# Patient Record
Sex: Female | Born: 1965 | Race: White | Hispanic: No | Marital: Married | Smoking: Never smoker
Health system: Southern US, Community
[De-identification: ages and names within clinical notes are randomized; demographics above are authoritative.]

## PROBLEM LIST (undated history)

## (undated) DIAGNOSIS — T7840XA Allergy, unspecified, initial encounter: Secondary | ICD-10-CM

## (undated) DIAGNOSIS — O24419 Gestational diabetes mellitus in pregnancy, unspecified control: Secondary | ICD-10-CM

## (undated) DIAGNOSIS — J302 Other seasonal allergic rhinitis: Secondary | ICD-10-CM

## (undated) HISTORY — DX: Allergy, unspecified, initial encounter: T78.40XA

## (undated) HISTORY — PX: WISDOM TOOTH EXTRACTION: SHX21

## (undated) HISTORY — DX: Gestational diabetes mellitus in pregnancy, unspecified control: O24.419

## (undated) HISTORY — PX: POLYPECTOMY: SHX149

## (undated) HISTORY — DX: Other seasonal allergic rhinitis: J30.2

---

## 1999-08-22 ENCOUNTER — Other Ambulatory Visit: Admission: RE | Admit: 1999-08-22 | Discharge: 1999-08-22 | Payer: Self-pay | Admitting: Obstetrics and Gynecology

## 2001-07-31 ENCOUNTER — Other Ambulatory Visit: Admission: RE | Admit: 2001-07-31 | Discharge: 2001-07-31 | Payer: Self-pay | Admitting: Obstetrics and Gynecology

## 2002-09-03 ENCOUNTER — Other Ambulatory Visit: Admission: RE | Admit: 2002-09-03 | Discharge: 2002-09-03 | Payer: Self-pay | Admitting: Obstetrics and Gynecology

## 2003-09-06 ENCOUNTER — Other Ambulatory Visit: Admission: RE | Admit: 2003-09-06 | Discharge: 2003-09-06 | Payer: Self-pay | Admitting: Obstetrics and Gynecology

## 2004-12-05 ENCOUNTER — Other Ambulatory Visit: Admission: RE | Admit: 2004-12-05 | Discharge: 2004-12-05 | Payer: Self-pay | Admitting: Obstetrics and Gynecology

## 2013-05-04 ENCOUNTER — Other Ambulatory Visit: Payer: Self-pay | Admitting: Obstetrics and Gynecology

## 2013-05-04 DIAGNOSIS — R928 Other abnormal and inconclusive findings on diagnostic imaging of breast: Secondary | ICD-10-CM

## 2013-05-12 ENCOUNTER — Ambulatory Visit
Admission: RE | Admit: 2013-05-12 | Discharge: 2013-05-12 | Disposition: A | Discharge status: Home / Self Care | Payer: BC Managed Care – PPO | Source: Ambulatory Visit | Attending: Obstetrics and Gynecology | Admitting: Obstetrics and Gynecology

## 2013-05-12 DIAGNOSIS — R928 Other abnormal and inconclusive findings on diagnostic imaging of breast: Secondary | ICD-10-CM

## 2014-07-05 ENCOUNTER — Encounter: Payer: Self-pay | Admitting: Internal Medicine

## 2014-07-05 ENCOUNTER — Ambulatory Visit (INDEPENDENT_AMBULATORY_CARE_PROVIDER_SITE_OTHER): Payer: BC Managed Care – PPO | Admitting: Internal Medicine

## 2014-07-05 VITALS — BP 112/80 | HR 84 | Temp 97.9°F | Resp 16 | Ht 60.25 in | Wt 131.2 lb

## 2014-07-05 DIAGNOSIS — O24419 Gestational diabetes mellitus in pregnancy, unspecified control: Secondary | ICD-10-CM

## 2014-07-05 DIAGNOSIS — O99810 Abnormal glucose complicating pregnancy: Secondary | ICD-10-CM

## 2014-07-05 DIAGNOSIS — E8881 Metabolic syndrome: Secondary | ICD-10-CM

## 2014-07-05 DIAGNOSIS — K2100 Gastro-esophageal reflux disease with esophagitis, without bleeding: Secondary | ICD-10-CM

## 2014-07-05 DIAGNOSIS — E88819 Insulin resistance, unspecified: Secondary | ICD-10-CM

## 2014-07-05 DIAGNOSIS — R7309 Other abnormal glucose: Secondary | ICD-10-CM

## 2014-07-05 DIAGNOSIS — E559 Vitamin D deficiency, unspecified: Secondary | ICD-10-CM

## 2014-07-05 DIAGNOSIS — Z1212 Encounter for screening for malignant neoplasm of rectum: Secondary | ICD-10-CM

## 2014-07-05 DIAGNOSIS — Z111 Encounter for screening for respiratory tuberculosis: Secondary | ICD-10-CM

## 2014-07-05 DIAGNOSIS — Z0000 Encounter for general adult medical examination without abnormal findings: Secondary | ICD-10-CM

## 2014-07-05 HISTORY — DX: Gestational diabetes mellitus in pregnancy, unspecified control: O24.419

## 2014-07-05 NOTE — Progress Notes (Signed)
Patient ID: Garnet Koyanagi, female   DOB: [birth year], 48 y.o.   MRN: [id number]  Annual Screening Comprehensive Examination  This very nice 48 y.o. MWFpresents for complete physical.  Patient has been followed for Prediabetes and Vitamin D Deficiency.   Patient's BP has been controlled and today's BP is 112/80 mmHg.  Patient denies any cardiac symptoms as chest pain, palpitations, shortness of breath, dizziness or ankle swelling.     Patient's Lipids controlled with diet.  Last lipids were TC 177, TG 82, HDL 68 and LDL 93 - all at goal in Sept 2014.  Patient has Gestational Diabetes in 1996  and patient denies reactive hypoglycemic symptoms, visual blurring, diabetic polys, or paresthesias. In 2013 she hasd a normal A1c 5.4% with elevated insulin of 39 suspect for insulin resistance. Last A1c was 5.3% with normal insulin in Sept 2014.   Finally, patient has history of Vitamin D Deficiency (35 in 2009) and last Vitamin D was 90 in Sept 2014..  Medication Sig   G Zyrtec 10 mg  prn  . Cholecalciferol (VITAMIN D PO) Take 2,000  Units by mouth daily.    No Known Allergies  FHx - unremarkable SHX - M x 24 yrs - Husb 48 yo =- Worked 16 yr for [employer].   History  Substance Use Topics  . Smoking status: Never Smoker   . Smokeless tobacco: Never Used  . Alcohol Use: No     Comment: Social    ROS Constitutional: Denies fever, chills, weight loss/gain, headaches, insomnia, fatigue, night sweats, and change in appetite. Eyes: Denies redness, blurred vision, diplopia, discharge, itchy, watery eyes.  ENT: Denies discharge, congestion, post nasal drip, epistaxis, sore throat, earache, hearing loss, dental pain, Tinnitus, Vertigo, Sinus pain, snoring.  Cardio: Denies chest pain, palpitations, irregular heartbeat, syncope, dyspnea, diaphoresis, orthopnea, PND, claudication, edema Respiratory: denies cough, dyspnea, DOE, pleurisy, hoarseness, laryngitis, wheezing.  Gastrointestinal: Denies  dysphagia, heartburn, reflux, water brash, pain, cramps, nausea, vomiting, bloating, diarrhea, constipation, hematemesis, melena, hematochezia, jaundice, hemorrhoids Genitourinary: Denies dysuria, frequency, urgency, nocturia, hesitancy, discharge, hematuria, flank pain Breast: Breast lumps, nipple discharge, bleeding.  Musculoskeletal: Denies arthralgia, myalgia, stiffness, Jt. Swelling, pain, limp, and strain/sprain. Denies falls. Skin: Denies puritis, rash, hives, warts, acne, eczema, changing in skin lesion Neuro: No weakness, tremor, incoordination, spasms, paresthesia, pain Psychiatric: Denies confusion, memory loss, sensory loss. Denies Depression. Endocrine: Denies change in weight, skin, hair change, nocturia, and paresthesia, diabetic polys, visual blurring, hyper / hypo glycemic episodes.  Heme/Lymph: No excessive bleeding, bruising, enlarged lymph nodes.  Physical Exam  BP 112/80  P 84  T 97.9 F   R 16  Ht 5' 0.25"   Wt 131 lb   BMI 25.42   General Appearance: Well nourished and in no apparent distress. Eyes: PERRLA, EOMs, conjunctiva no swelling or erythema, normal fundi and vessels. Sinuses: No frontal/maxillary tenderness ENT/Mouth: EACs patent / TMs  nl. Nares clear without erythema, swelling, mucoid exudates. Oral hygiene is good. No erythema, swelling, or exudate. Tongue normal, non-obstructing. Tonsils not swollen or erythematous. Hearing normal.  Neck: Supple, thyroid normal. No bruits, nodes or JVD. Respiratory: Respiratory effort normal.  BS equal and clear bilateral without rales, rhonci, wheezing or stridor. Cardio: Heart sounds are normal with regular rate and rhythm and no murmurs, rubs or gallops. Peripheral pulses are normal and equal bilaterally without edema. No aortic or femoral bruits. Chest: symmetric with normal excursions and percussion. Breasts: Deferred to GYN. Abdomen: Flat, soft, with bowl sounds. Nontender,  no guarding, rebound, hernias, masses,  or organomegaly.  Lymphatics: Non tender without lymphadenopathy.  Genitourinary: Deferred to GYN. Musculoskeletal: Full ROM all peripheral extremities, joint stability, 5/5 strength, and normal gait. Skin: Warm and dry without rashes, lesions, cyanosis, clubbing or  ecchymosis.  Neuro: Cranial nerves intact, reflexes equal bilaterally. Normal muscle tone, no cerebellar symptoms. Sensation intact.  Pysch: Awake and oriented X 3, normal affect, Insight and Judgment appropriate.   Assessment and Plan  1. Annual Screening Examination 2. Pre Diabetes/Hx of Insulin Resistance 3. Vitamin D Deficiency  Continue prudent diet as discussed, weight control, BP monitoring, regular exercise, and medications. Discussed med's effects and SE's. Screening labs and tests as requested with regular follow-up as recommended. Recommended Dr Fara Olden Fuhrman's books "The End of Dieting ...the End of Diabetes"

## 2014-07-05 NOTE — Patient Instructions (Signed)
 Recommend the book "The END of DIETING" by Dr Joel Furman   and the book "The END of DIABETES " by Dr Joel Fuhrman  At [employer] - get book & Audio CD's      Being diabetic has a  300% increased risk for heart attack, stroke, cancer, and alzheimer- type vascular dementia. It is very important that you work harder with diet by avoiding all foods that are white except chicken & fish. Avoid white rice (brown & wild rice is OK), white potatoes (sweetpotatoes in moderation is OK), White bread or wheat bread or anything made out of white flour like bagels, donuts, rolls, buns, biscuits, cakes, pastries, cookies, pizza crust, and pasta (made from white flour & egg whites) - vegetarian pasta or spinach or wheat pasta is OK. Multigrain breads like Arnold's or [employer], or multigrain sandwich thins or flatbreads.  Diet, exercise and weight loss can reverse and cure diabetes in the early stages.  Diet, exercise and weight loss is very important in the control and prevention of complications of diabetes which affects every system in your body, ie. Brain - dementia/stroke, eyes - glaucoma/blindness, heart - heart attack/heart failure, kidneys - dialysis, stomach - gastric paralysis, intestines - malabsorption, nerves - severe painful neuritis, circulation - gangrene & loss of a leg(s), and finally cancer and Alzheimers.    I recommend avoid fried & greasy foods,  sweets/candy, white rice (brown or wild rice or Quinoa is OK), white potatoes (sweet potatoes are OK) - anything made from white flour - bagels, doughnuts, rolls, buns, biscuits,white and wheat breads, pizza crust and traditional pasta made of white flour & egg white(vegetarian pasta or spinach or wheat pasta is OK).  Multi-grain bread is OK - like multi-grain flat bread or sandwich thins. Avoid alcohol in excess. Exercise is also important.    Eat all the vegetables you want - avoid meat, especially red meat and dairy - especially cheese.  Cheese  is the most concentrated form of trans-fats which is the worst thing to clog up our arteries. Veggie cheese is OK which can be found in the fresh produce section at [employer] or [employer] or [employer]  Preventive Care for Adults A healthy lifestyle and preventive care can promote health and wellness. Preventive health guidelines for women include the following key practices.  A routine yearly physical is a good way to check with your health care provider about your health and preventive screening. It is a chance to share any concerns and updates on your health and to receive a thorough exam.  Visit your dentist for a routine exam and preventive care every 6 months. Brush your teeth twice a day and floss once a day. Good oral hygiene prevents tooth decay and gum disease.  The frequency of eye exams is based on your age, health, family medical history, use of contact lenses, and other factors. Follow your health care provider's recommendations for frequency of eye exams.  Eat a healthy diet. Foods like vegetables, fruits, whole grains, low-fat dairy products, and lean protein foods contain the nutrients you need without too many calories. Decrease your intake of foods high in solid fats, added sugars, and salt. Eat the right amount of calories for you.Get information about a proper diet from your health care provider, if necessary.  Regular physical exercise is one of the most important things you can do for your health. Most adults should get at least 150 minutes of moderate-intensity exercise (any activity that increases   your heart rate and causes you to sweat) each week. In addition, most adults need muscle-strengthening exercises on 2 or more days a week.  Maintain a healthy weight. The body mass index (BMI) is a screening tool to identify possible weight problems. It provides an estimate of body fat based on height and weight. Your health care provider can find your BMI and can help you  achieve or maintain a healthy weight.For adults 20 years and older:  A BMI below 18.5 is considered underweight.  A BMI of 18.5 to 24.9 is normal.  A BMI of 25 to 29.9 is considered overweight.  A BMI of 30 and above is considered obese.  Maintain normal blood lipids and cholesterol levels by exercising and minimizing your intake of saturated fat. Eat a balanced diet with plenty of fruit and vegetables. Blood tests for lipids and cholesterol should begin at age 20 and be repeated every 5 years. If your lipid or cholesterol levels are high, you are over 50, or you are at high risk for heart disease, you may need your cholesterol levels checked more frequently.Ongoing high lipid and cholesterol levels should be treated with medicines if diet and exercise are not working.  If you smoke, find out from your health care provider how to quit. If you do not use tobacco, do not start.  Lung cancer screening is recommended for adults aged 55-80 years who are at high risk for developing lung cancer because of a history of smoking. A yearly low-dose CT scan of the lungs is recommended for people who have at least a 30-pack-year history of smoking and are a current smoker or have quit within the past 15 years. A pack year of smoking is smoking an average of 1 pack of cigarettes a day for 1 year (for example: 1 pack a day for 30 years or 2 packs a day for 15 years). Yearly screening should continue until the smoker has stopped smoking for at least 15 years. Yearly screening should be stopped for people who develop a health problem that would prevent them from having lung cancer treatment.  If you are pregnant, do not drink alcohol. If you are breastfeeding, be very cautious about drinking alcohol. If you are not pregnant and choose to drink alcohol, do not have more than 1 drink per day. One drink is considered to be 12 ounces (355 mL) of beer, 5 ounces (148 mL) of wine, or 1.5 ounces (44 mL) of liquor.  Avoid  use of street drugs. Do not share needles with anyone. Ask for help if you need support or instructions about stopping the use of drugs.  High blood pressure causes heart disease and increases the risk of stroke. Your blood pressure should be checked at least every 1 to 2 years. Ongoing high blood pressure should be treated with medicines if weight loss and exercise do not work.  If you are 55-79 years old, ask your health care provider if you should take aspirin to prevent strokes.  Diabetes screening involves taking a blood sample to check your fasting blood sugar level. This should be done once every 3 years, after age 45, if you are within normal weight and without risk factors for diabetes. Testing should be considered at a younger age or be carried out more frequently if you are overweight and have at least 1 risk factor for diabetes.  Breast cancer screening is essential preventive care for women. You should practice "breast self-awareness." This means understanding the   understanding the normal appearance and feel of your breasts and may include breast self-examination. Any changes detected, no matter how small, should be reported to a health care provider. Women in their 76s and 30s should have a clinical breast exam (CBE) by a health care provider as part of a regular health exam every 1 to 3 years. After age 8, women should have a CBE every year. Starting at age 33, women should consider having a mammogram (breast X-ray test) every year. Women who have a family history of breast cancer should talk to their health care provider about genetic screening. Women at a high risk of breast cancer should talk to their health care providers about having an MRI and a mammogram every year.  Breast cancer gene (BRCA)-related cancer risk assessment is recommended for women who have family members with BRCA-related cancers. BRCA-related cancers  include breast, ovarian, tubal, and peritoneal cancers. Having family members with these cancers may be associated with an increased risk for harmful changes (mutations) in the breast cancer genes BRCA1 and BRCA2. Results of the assessment will determine the need for genetic counseling and BRCA1 and BRCA2 testing.  Routine pelvic exams to screen for cancer are no longer recommended for nonpregnant women who are considered low risk for cancer of the pelvic organs (ovaries, uterus, and vagina) and who do not have symptoms. Ask your health care provider if a screening pelvic exam is right for you.  If you have had past treatment for cervical cancer or a condition that could lead to cancer, you need Pap tests and screening for cancer for at least 20 years after your treatment. If Pap tests have been discontinued, your risk factors (such as having a new sexual partner) need to be reassessed to determine if screening should be resumed. Some women have medical problems that increase the chance of getting cervical cancer. In these cases, your health care provider may recommend more frequent screening and Pap tests.  The HPV test is an additional test that may be used for cervical cancer screening. The HPV test looks for the virus that can cause the cell changes on the cervix. The cells collected during the Pap test can be tested for HPV. The HPV test could be used to screen women aged 48 years and older, and should be used in women of any age who have unclear Pap test results. After the age of 48, women should have HPV testing at the same frequency as a Pap test.  Colorectal cancer can be detected and often prevented. Most routine colorectal cancer screening begins at the age of 12 years and continues through age 96 years. However, your health care provider may recommend screening at an earlier age if you have risk factors for colon cancer. On a yearly basis, your health care provider may provide home test kits to  check for hidden blood in the stool. Use of a small camera at the end of a tube, to directly examine the colon (sigmoidoscopy or colonoscopy), can detect the earliest forms of colorectal cancer. Talk to your health care provider about this at age 33, when routine screening begins. Direct exam of the colon should be repeated every 5-10 years through age 53 years, unless early forms of pre-cancerous polyps or small growths are found.  People who are at an increased risk for hepatitis B should be screened for this virus. You are considered at high risk for hepatitis B if:  You were born in a country where hepatitis  often. Talk with your health care provider about which countries are considered high risk.  Your parents were born in a high-risk country and you have not received a shot to protect against hepatitis B (hepatitis B vaccine).  You have HIV or AIDS.  You use needles to inject street drugs.  You live with, or have sex with, someone who has hepatitis B.  You get hemodialysis treatment.  You take certain medicines for conditions like cancer, organ transplantation, and autoimmune conditions.  Hepatitis C blood testing is recommended for all people born from 1945 through 1965 and any individual with known risks for hepatitis C.  Practice safe sex. Use condoms and avoid high-risk sexual practices to reduce the spread of sexually transmitted infections (STIs). STIs include gonorrhea, chlamydia, syphilis, trichomonas, herpes, HPV, and human immunodeficiency virus (HIV). Herpes, HIV, and HPV are viral illnesses that have no cure. They can result in disability, cancer, and death.  You should be screened for sexually transmitted illnesses (STIs) including gonorrhea and chlamydia if:  You are sexually active and are younger than 24 years.  You are older than 24 years and your health care provider tells you that you are at risk for this type of infection.  Your sexual activity has changed since you were last screened and you are at an increased risk for chlamydia or  gonorrhea. Ask your health care provider if you are at risk.  If you are at risk of being infected with HIV, it is recommended that you take a prescription medicine daily to prevent HIV infection. This is called preexposure prophylaxis (PrEP). You are considered at risk if:  You are a heterosexual woman, are sexually active, and are at increased risk for HIV infection.  You take drugs by injection.  You are sexually active with a partner who has HIV.  Talk with your health care provider about whether you are at high risk of being infected with HIV. If you choose to begin PrEP, you should first be tested for HIV. You should then be tested every 3 months for as long as you are taking PrEP.  Osteoporosis is a disease in which the bones lose minerals and strength with aging. This can result in serious bone fractures or breaks. The risk of osteoporosis can be identified using a bone density scan. Women ages 65 years and over and women at risk for fractures or osteoporosis should discuss screening with their health care providers. Ask your health care provider whether you should take a calcium supplement or vitamin D to reduce the rate of osteoporosis.  Menopause can be associated with physical symptoms and risks. Hormone replacement therapy is available to decrease symptoms and risks. You should talk to your health care provider about whether hormone replacement therapy is right for you.  Use sunscreen. Apply sunscreen liberally and repeatedly throughout the day. You should seek shade when your shadow is shorter than you. Protect yourself by wearing long sleeves, pants, a wide-brimmed hat, and sunglasses year round, whenever you are outdoors.  Once a month, do a whole body skin exam, using a mirror to look at the skin on your back. Tell your health care provider of new moles, moles that have irregular borders, moles that are larger than a pencil eraser, or moles that have changed in shape or  color.  Stay current with required vaccines (immunizations).  Influenza vaccine. All adults should be immunized every year.  Tetanus, diphtheria, and acellular pertussis (Td, Tdap) vaccine. Pregnant women should receive   1 dose of Tdap vaccine during each pregnancy. The dose should be obtained regardless of the length of time since the last dose. Immunization is preferred during the 27th-36th week of gestation. An adult who has not previously received Tdap or who does not know her vaccine status should receive 1 dose of Tdap. This initial dose should be followed by tetanus and diphtheria toxoids (Td) booster doses every 10 years. Adults with an unknown or incomplete history of completing a 3-dose immunization series with Td-containing vaccines should begin or complete a primary immunization series including a Tdap dose. Adults should receive a Td booster every 10 years.  Varicella vaccine. An adult without evidence of immunity to varicella should receive 2 doses or a second dose if she has previously received 1 dose. Pregnant females who do not have evidence of immunity should receive the first dose after pregnancy. This first dose should be obtained before leaving the health care facility. The second dose should be obtained 4-8 weeks after the first dose.  Human papillomavirus (HPV) vaccine. Females aged 13-26 years who have not received the vaccine previously should obtain the 3-dose series. The vaccine is not recommended for use in pregnant females. However, pregnancy testing is not needed before receiving a dose. If a female is found to be pregnant after receiving a dose, no treatment is needed. In that case, the remaining doses should be delayed until after the pregnancy. Immunization is recommended for any person with an immunocompromised condition through the age of 26 years if she did not get any or all doses earlier. During the 3-dose series, the second dose should be obtained 4-8 weeks after the  first dose. The third dose should be obtained 24 weeks after the first dose and 16 weeks after the second dose.  Zoster vaccine. One dose is recommended for adults aged 60 years or older unless certain conditions are present.  Measles, mumps, and rubella (MMR) vaccine. Adults born before 1957 generally are considered immune to measles and mumps. Adults born in 1957 or later should have 1 or more doses of MMR vaccine unless there is a contraindication to the vaccine or there is laboratory evidence of immunity to each of the three diseases. A routine second dose of MMR vaccine should be obtained at least 28 days after the first dose for students attending postsecondary schools, health care workers, or international travelers. People who received inactivated measles vaccine or an unknown type of measles vaccine during 1963-1967 should receive 2 doses of MMR vaccine. People who received inactivated mumps vaccine or an unknown type of mumps vaccine before 1979 and are at high risk for mumps infection should consider immunization with 2 doses of MMR vaccine. For females of childbearing age, rubella immunity should be determined. If there is no evidence of immunity, females who are not pregnant should be vaccinated. If there is no evidence of immunity, females who are pregnant should delay immunization until after pregnancy. Unvaccinated health care workers born before 1957 who lack laboratory evidence of measles, mumps, or rubella immunity or laboratory confirmation of disease should consider measles and mumps immunization with 2 doses of MMR vaccine or rubella immunization with 1 dose of MMR vaccine.  Pneumococcal 13-valent conjugate (PCV13) vaccine. When indicated, a person who is uncertain of her immunization history and has no record of immunization should receive the PCV13 vaccine. An adult aged 19 years or older who has certain medical conditions and has not been previously immunized should receive 1 dose of    dose of PCV13 vaccine. This PCV13 should be followed with a dose of pneumococcal polysaccharide (PPSV23) vaccine. The PPSV23 vaccine dose should be obtained at least 8 weeks after the dose of PCV13 vaccine. An adult aged 11 years or older who has certain medical conditions and previously received 1 or more doses of PPSV23 vaccine should receive 1 dose of PCV13. The PCV13 vaccine dose should be obtained 1 or more years after the last PPSV23 vaccine dose.  Pneumococcal polysaccharide (PPSV23) vaccine. When PCV13 is also indicated, PCV13 should be obtained first. All adults aged 12 years and older should be immunized. An adult younger than age 42 years who has certain medical conditions should be immunized. Any person who resides in a nursing home or long-term care facility should be immunized. An adult smoker should be immunized. People with an immunocompromised condition and certain other conditions should receive both PCV13 and PPSV23 vaccines. People with human immunodeficiency virus (HIV) infection should be immunized as soon as possible after diagnosis. Immunization during chemotherapy or radiation therapy should be avoided. Routine use of PPSV23 vaccine is not recommended for American Indians, [place] Natives, or people younger than 65 years unless there are medical conditions that require PPSV23 vaccine. When indicated, people who have unknown immunization and have no record of immunization should receive PPSV23 vaccine. One-time revaccination 5 years after the first dose of PPSV23 is recommended for people aged 19-64 years who have chronic kidney failure, nephrotic syndrome, asplenia, or immunocompromised conditions. People who received 1-2 doses of PPSV23 before age 96 years should receive another dose of PPSV23 vaccine at age 73 years or later if at least 5 years have passed since the previous dose. Doses of PPSV23 are not needed for  people immunized with PPSV23 at or after age 85 years.  Meningococcal vaccine. Adults with asplenia or persistent complement component deficiencies should receive 2 doses of quadrivalent meningococcal conjugate (MenACWY-D) vaccine. The doses should be obtained at least 2 months apart. Microbiologists working with certain meningococcal bacteria, [employer] recruits, people at risk during an outbreak, and people who travel to or live in countries with a high rate of meningitis should be immunized. A first-year college student up through age 28 years who is living in a residence hall should receive a dose if she did not receive a dose on or after her 16th birthday. Adults who have certain high-risk conditions should receive one or more doses of vaccine.  Hepatitis A vaccine. Adults who wish to be protected from this disease, have certain high-risk conditions, work with hepatitis A-infected animals, work in hepatitis A research labs, or travel to or work in countries with a high rate of hepatitis A should be immunized. Adults who were previously unvaccinated and who anticipate close contact with an international adoptee during the first 60 days after arrival in the [place] from a country with a high rate of hepatitis A should be immunized.  Hepatitis B vaccine. Adults who wish to be protected from this disease, have certain high-risk conditions, may be exposed to blood or other infectious body fluids, are household contacts or sex partners of hepatitis B positive people, are clients or workers in certain care facilities, or travel to or work in countries with a high rate of hepatitis B should be immunized.  Haemophilus influenzae type b (Hib) vaccine. A previously unvaccinated person with asplenia or sickle cell disease or having a scheduled splenectomy should receive 1 dose of Hib vaccine. Regardless of previous immunization, a recipient of a  cell transplant should receive a 3-dose series 6-12 months after her successful transplant. Hib vaccine is not recommended for  adults with HIV infection. Preventive Services / Frequency  Ages 40 to 64 years  Blood pressure check.** / Every 1 to 2 years.  Lipid and cholesterol check.** / Every 5 years beginning at age 20 years.  Lung cancer screening. / Every year if you are aged 55-80 years and have a 30-pack-year history of smoking and currently smoke or have quit within the past 15 years. Yearly screening is stopped once you have quit smoking for at least 15 years or develop a health problem that would prevent you from having lung cancer treatment.  Clinical breast exam.** / Every year after age 40 years.  BRCA-related cancer risk assessment.** / For women who have family members with a BRCA-related cancer (breast, ovarian, tubal, or peritoneal cancers).  Mammogram.** / Every year beginning at age 40 years and continuing for as long as you are in good health. Consult with your health care provider.  Pap test.** / Every 3 years starting at age 30 years through age 65 or 70 years with a history of 3 consecutive normal Pap tests.  HPV screening.** / Every 3 years from ages 30 years through ages 65 to 70 years with a history of 3 consecutive normal Pap tests.  Fecal occult blood test (FOBT) of stool. / Every year beginning at age 50 years and continuing until age 75 years. You may not need to do this test if you get a colonoscopy every 10 years.  Flexible sigmoidoscopy or colonoscopy.** / Every 5 years for a flexible sigmoidoscopy or every 10 years for a colonoscopy beginning at age 50 years and continuing until age 75 years.  Hepatitis C blood test.** / For all people born from 1945 through 1965 and any individual with known risks for hepatitis C.  Skin self-exam. / Monthly.  Influenza vaccine. / Every year.  Tetanus, diphtheria, and acellular pertussis (Tdap/Td) vaccine.** / Consult your health care provider. Pregnant women should receive 1 dose of Tdap vaccine during each pregnancy. 1 dose of Td every 10  years.  Varicella vaccine.** / Consult your health care provider. Pregnant females who do not have evidence of immunity should receive the first dose after pregnancy.  Zoster vaccine.** / 1 dose for adults aged 60 years or older.  Measles, mumps, rubella (MMR) vaccine.** / You need at least 1 dose of MMR if you were born in 1957 or later. You may also need a 2nd dose. For females of childbearing age, rubella immunity should be determined. If there is no evidence of immunity, females who are not pregnant should be vaccinated. If there is no evidence of immunity, females who are pregnant should delay immunization until after pregnancy.  Pneumococcal 13-valent conjugate (PCV13) vaccine.** / Consult your health care provider.  Pneumococcal polysaccharide (PPSV23) vaccine.** / 1 to 2 doses if you smoke cigarettes or if you have certain conditions.  Meningococcal vaccine.** / Consult your health care provider.  Hepatitis A vaccine.** / Consult your health care provider.  Hepatitis B vaccine.** / Consult your health care provider.  Haemophilus influenzae type b (Hib) vaccine.** / Consult your health care provider.  

## 2014-07-06 LAB — URINALYSIS, MICROSCOPIC ONLY
Bacteria, UA: NONE SEEN
Casts: NONE SEEN
Crystals: NONE SEEN
Squamous Epithelial / LPF: NONE SEEN

## 2014-07-06 LAB — CBC WITH DIFFERENTIAL/PLATELET
Basophils Absolute: 0.1 10*3/uL (ref 0.0–0.1)
Basophils Relative: 1 % (ref 0–1)
Eosinophils Absolute: 0.2 10*3/uL (ref 0.0–0.7)
Eosinophils Relative: 2 % (ref 0–5)
HCT: 38.3 % (ref 36.0–46.0)
Hemoglobin: 12.5 g/dL (ref 12.0–15.0)
Lymphocytes Relative: 29 % (ref 12–46)
Lymphs Abs: 2.9 10*3/uL (ref 0.7–4.0)
MCH: 29.8 pg (ref 26.0–34.0)
MCHC: 32.6 g/dL (ref 30.0–36.0)
MCV: 91.2 fL (ref 78.0–100.0)
Monocytes Absolute: 0.7 10*3/uL (ref 0.1–1.0)
Monocytes Relative: 7 % (ref 3–12)
Neutro Abs: 6.1 10*3/uL (ref 1.7–7.7)
Neutrophils Relative %: 61 % (ref 43–77)
Platelets: 325 10*3/uL (ref 150–400)
RBC: 4.2 MIL/uL (ref 3.87–5.11)
RDW: 13.9 % (ref 11.5–15.5)
WBC: 10 10*3/uL (ref 4.0–10.5)

## 2014-07-06 LAB — HEPATIC FUNCTION PANEL
ALT: 25 U/L (ref 0–35)
AST: 24 U/L (ref 0–37)
Albumin: 4.4 g/dL (ref 3.5–5.2)
Alkaline Phosphatase: 66 U/L (ref 39–117)
Bilirubin, Direct: <0.1 mg/dL (ref 0.0–0.3)
Total Bilirubin: 0.2 mg/dL (ref 0.2–1.2)
Total Protein: 7.1 g/dL (ref 6.0–8.3)

## 2014-07-06 LAB — BASIC METABOLIC PANEL WITH GFR
BUN: 16 mg/dL (ref 6–23)
CO2: 23 mEq/L (ref 19–32)
Calcium: 9.3 mg/dL (ref 8.4–10.5)
Chloride: 103 mEq/L (ref 96–112)
Creat: 0.66 mg/dL (ref 0.50–1.10)
GFR, Est African American: >89 mL/min
GFR, Est Non African American: >89 mL/min
Glucose, Bld: 79 mg/dL (ref 70–99)
Potassium: 4.2 mEq/L (ref 3.5–5.3)
Sodium: 138 mEq/L (ref 135–145)

## 2014-07-06 LAB — LIPID PANEL
Cholesterol: 177 mg/dL (ref 0–200)
HDL: 73 mg/dL (ref >39)
LDL Cholesterol: 76 mg/dL (ref 0–99)
Total CHOL/HDL Ratio: 2.4 Ratio
Triglycerides: 139 mg/dL (ref <150)
VLDL: 28 mg/dL (ref 0–40)

## 2014-07-06 LAB — TSH: TSH: 1.218 u[IU]/mL (ref 0.350–4.500)

## 2014-07-06 LAB — MICROALBUMIN / CREATININE URINE RATIO
Creatinine, Urine: 58.1 mg/dL
Microalb Creat Ratio: 8.6 mg/g (ref 0.0–30.0)
Microalb, Ur: 0.5 mg/dL (ref 0.00–1.89)

## 2014-07-06 LAB — MAGNESIUM: Magnesium: 1.9 mg/dL (ref 1.5–2.5)

## 2014-07-06 LAB — VITAMIN B12: Vitamin B-12: 333 pg/mL (ref 211–911)

## 2014-07-06 LAB — HEMOGLOBIN A1C
Hgb A1c MFr Bld: 5.2 % (ref <5.7)
Mean Plasma Glucose: 103 mg/dL (ref <117)

## 2014-07-06 LAB — VITAMIN D 25 HYDROXY (VIT D DEFICIENCY, FRACTURES): Vit D, 25-Hydroxy: 116 ng/mL — ABNORMAL HIGH (ref 30–89)

## 2014-07-06 LAB — INSULIN, FASTING: Insulin fasting, serum: 11.2 u[IU]/mL (ref 2.0–19.6)

## 2014-07-08 LAB — TB SKIN TEST
Induration: 0 mm
TB Skin Test: NEGATIVE

## 2015-04-18 ENCOUNTER — Other Ambulatory Visit: Payer: Self-pay

## 2015-07-07 ENCOUNTER — Ambulatory Visit (INDEPENDENT_AMBULATORY_CARE_PROVIDER_SITE_OTHER): Payer: BLUE CROSS/BLUE SHIELD | Admitting: Internal Medicine

## 2015-07-07 ENCOUNTER — Encounter: Payer: Self-pay | Admitting: Internal Medicine

## 2015-07-07 VITALS — BP 116/82 | HR 100 | Temp 98.1°F | Resp 18 | Ht 60.0 in | Wt 131.0 lb

## 2015-07-07 DIAGNOSIS — E559 Vitamin D deficiency, unspecified: Secondary | ICD-10-CM

## 2015-07-07 DIAGNOSIS — Z0000 Encounter for general adult medical examination without abnormal findings: Secondary | ICD-10-CM | POA: Diagnosis not present

## 2015-07-07 DIAGNOSIS — Z111 Encounter for screening for respiratory tuberculosis: Secondary | ICD-10-CM | POA: Diagnosis not present

## 2015-07-07 DIAGNOSIS — Z6825 Body mass index (BMI) 25.0-25.9, adult: Secondary | ICD-10-CM

## 2015-07-07 DIAGNOSIS — R030 Elevated blood-pressure reading, without diagnosis of hypertension: Secondary | ICD-10-CM

## 2015-07-07 DIAGNOSIS — Z1212 Encounter for screening for malignant neoplasm of rectum: Secondary | ICD-10-CM

## 2015-07-07 DIAGNOSIS — Z23 Encounter for immunization: Secondary | ICD-10-CM | POA: Diagnosis not present

## 2015-07-07 DIAGNOSIS — IMO0001 Reserved for inherently not codable concepts without codable children: Secondary | ICD-10-CM

## 2015-07-07 DIAGNOSIS — E7800 Pure hypercholesterolemia, unspecified: Secondary | ICD-10-CM

## 2015-07-07 DIAGNOSIS — Z79899 Other long term (current) drug therapy: Secondary | ICD-10-CM | POA: Diagnosis not present

## 2015-07-07 DIAGNOSIS — E88819 Insulin resistance, unspecified: Secondary | ICD-10-CM

## 2015-07-07 DIAGNOSIS — R5383 Other fatigue: Secondary | ICD-10-CM

## 2015-07-07 DIAGNOSIS — E8881 Metabolic syndrome: Secondary | ICD-10-CM

## 2015-07-07 LAB — HEPATIC FUNCTION PANEL
ALT: 22 U/L (ref 6–29)
AST: 23 U/L (ref 10–35)
Albumin: 4.7 g/dL (ref 3.6–5.1)
Alkaline Phosphatase: 64 U/L (ref 33–115)
Bilirubin, Direct: 0.1 mg/dL (ref <=0.2)
Indirect Bilirubin: 0.3 mg/dL (ref 0.2–1.2)
Total Bilirubin: 0.4 mg/dL (ref 0.2–1.2)
Total Protein: 7.8 g/dL (ref 6.1–8.1)

## 2015-07-07 LAB — CBC WITH DIFFERENTIAL/PLATELET
Basophils Absolute: 0 10*3/uL (ref 0.0–0.1)
Basophils Relative: 0 % (ref 0–1)
Eosinophils Absolute: 0.2 10*3/uL (ref 0.0–0.7)
Eosinophils Relative: 2 % (ref 0–5)
HCT: 41.8 % (ref 36.0–46.0)
Hemoglobin: 14.2 g/dL (ref 12.0–15.0)
Lymphocytes Relative: 30 % (ref 12–46)
Lymphs Abs: 3.3 10*3/uL (ref 0.7–4.0)
MCH: 30.6 pg (ref 26.0–34.0)
MCHC: 34 g/dL (ref 30.0–36.0)
MCV: 90.1 fL (ref 78.0–100.0)
MPV: 10.8 fL (ref 8.6–12.4)
Monocytes Absolute: 0.7 10*3/uL (ref 0.1–1.0)
Monocytes Relative: 6 % (ref 3–12)
Neutro Abs: 6.8 10*3/uL (ref 1.7–7.7)
Neutrophils Relative %: 62 % (ref 43–77)
Platelets: 332 10*3/uL (ref 150–400)
RBC: 4.64 MIL/uL (ref 3.87–5.11)
RDW: 13.4 % (ref 11.5–15.5)
WBC: 11 10*3/uL — ABNORMAL HIGH (ref 4.0–10.5)

## 2015-07-07 LAB — IRON AND TIBC
%SAT: 19 % (ref 11–50)
Iron: 69 ug/dL (ref 40–190)
TIBC: 362 ug/dL (ref 250–450)
UIBC: 293 ug/dL (ref 125–400)

## 2015-07-07 LAB — BASIC METABOLIC PANEL WITH GFR
BUN: 14 mg/dL (ref 7–25)
CO2: 24 mmol/L (ref 20–31)
Calcium: 9.9 mg/dL (ref 8.6–10.2)
Chloride: 101 mmol/L (ref 98–110)
Creat: 0.65 mg/dL (ref 0.50–1.10)
GFR, Est African American: >89 mL/min (ref >=60)
GFR, Est Non African American: >89 mL/min (ref >=60)
Glucose, Bld: 81 mg/dL (ref 65–99)
Potassium: 4.2 mmol/L (ref 3.5–5.3)
Sodium: 139 mmol/L (ref 135–146)

## 2015-07-07 LAB — LIPID PANEL
Cholesterol: 198 mg/dL (ref 125–200)
HDL: 83 mg/dL (ref >=46)
LDL Cholesterol: 95 mg/dL (ref <130)
Total CHOL/HDL Ratio: 2.4 Ratio (ref <=5.0)
Triglycerides: 102 mg/dL (ref <150)
VLDL: 20 mg/dL (ref <30)

## 2015-07-07 LAB — MAGNESIUM: Magnesium: 2.2 mg/dL (ref 1.5–2.5)

## 2015-07-07 NOTE — Patient Instructions (Signed)

## 2015-07-07 NOTE — Progress Notes (Signed)
Patient ID: Tricia Berger, female   DOB: [birth year], 49 y.o.   MRN: [id number]  Annual Screening Comprehensive Examination  This very nice 49 y.o. MWFpresents for complete physical.  Patient has been followed for Prediabetes and Vitamin D Deficiency.     Patient's BP has been controlled and today's BP is 116/82 mmHg and she denies  denies any cardiac symptoms as chest pain, palpitations, shortness of breath, dizziness or ankle swelling.       Patient's Lipids are controlled with diet and last lipids were at goal as below.  Lab Results  Component Value Date   CHOL 198 07/07/2015   HDL 83 07/07/2015   LDLCALC 95 07/07/2015   TRIG 102 07/07/2015   CHOLHDL 2.4 07/07/2015       Patient had hx/o Gestational Diabetes in 1996  and patient denies reactive hypoglycemic symptoms, visual blurring, diabetic polys, or paresthesias. In 2013 she had normal A1c 5.4% and elevated insulin of 39 suspect for insulin resistance. Last A1c was 5.3% with normal insulin in Sept 2014.     Finally, patient has history of Vitamin D Deficiency (35 in 2009) and last Vitamin D was 90 in Sept 2014..  Medication Sig   G Zyrtec 10 mg  prn  . Cholecalciferol (VITAMIN D PO) Take 2,000  Units by mouth daily.    No Known Allergies  FHx - unremarkable SHX - M x 24 yrs - Husb 49 yo - Worked 17 yr for [employer].   Social History  Substance Use Topics  . Smoking status: Never Smoker   . Smokeless tobacco: Never Used  . Alcohol Use: No     Comment: Social    ROS  In addition to the HPI above,  No Fever-chills,  No Headache, No changes with Vision or hearing,  No problems swallowing food or Liquids,  No Chest pain or productive Cough or Shortness of Breath,  No Abdominal pain, No Nausea or Vomitting, Bowel movements are regular,  No Blood in stool or Urine,  No dysuria,  No new skin rashes or bruises,  No new joints pains-aches,  No new weakness, tingling, numbness in any extremity,  No recent weight  loss,  No polyuria, polydypsia or polyphagia,  No significant Mental Stressors.  A full 10 point Review of Systems was done, except as stated above, all other Review of Systems were negative  Physical Exam  BP 116/82   Pulse 100  Temp 98.1 F   Resp 18  Ht 5'   Wt 131 lb     BMI 25.58  General Appearance: Well nourished and in no apparent distress. Eyes: PERRLA, EOMs, conjunctiva no swelling or erythema, normal fundi and vessels. Sinuses: No frontal/maxillary tenderness ENT/Mouth: EACs patent / TMs  nl. Nares clear without erythema, swelling, mucoid exudates. Oral hygiene is good. No erythema, swelling, or exudate. Tongue normal, non-obstructing. Tonsils not swollen or erythematous. Hearing normal.  Neck: Supple, thyroid normal. No bruits, nodes or JVD. Respiratory: Respiratory effort normal.  BS equal and clear bilateral without rales, rhonci, wheezing or stridor. Cardio: Heart sounds are normal with regular rate and rhythm and no murmurs, rubs or gallops. Peripheral pulses are normal and equal bilaterally without edema. No aortic or femoral bruits. Chest: symmetric with normal excursions and percussion. Breasts: Deferred to GYN. Abdomen: Flat, soft, with bowl sounds. Nontender, no guarding, rebound, hernias, masses, or organomegaly.  Lymphatics: Non tender without lymphadenopathy.  Genitourinary: Deferred to GYN. Musculoskeletal: Full ROM all peripheral extremities, joint stability,  5/5 strength, and normal gait. Skin: Warm and dry without rashes, lesions, cyanosis, clubbing or  ecchymosis.  Neuro: Cranial nerves intact, reflexes equal bilaterally. Normal muscle tone, no cerebellar symptoms. Sensation intact.  Pysch: Awake and oriented X 3, normal affect, Insight and Judgment appropriate.   Assessment and Plan  1. Annual Screening Examination 2. Pre Diabetes/Hx of Insulin Resistance 3. Vitamin D Deficiency  Continue prudent diet as discussed, weight control, BP monitoring,  regular exercise, and medications. Discussed med's effects and SE's. Screening labs and tests as requested with regular follow-up as recommended. Recommended Dr Fara Olden Fuhrman's books "The End of Dieting ...the End of Diabetes"  and is monitored expectantly for prediabetes  and patient denies reactive hypoglycemic symptoms, visual blurring, diabetic polys, or paresthesias. Last A1c was 5.2% in Sept 2016.    Finally, patient has history of Vitamin D Deficiency of 35 in 2009 and last Vitamin D was slightly elevated at 116 in sept 2016.       Medication Sig  . cetirizine (ZYRTEC) 10 MG tablet Take 10 mg by mouth daily. OTC  . Cholecalciferol (VITAMIN D PO) Take 2,000 Int'l Units by mouth daily.    No Known Allergies   No past medical history on file. Health Maintenance  Topic Date Due  . FOOT EXAM  11/03/1977  . OPHTHALMOLOGY EXAM  11/03/1977  . HIV Screening  11/03/1982  . PAP SMEAR  11/03/1988  . HEMOGLOBIN A1C  01/03/2015  . INFLUENZA VACCINE  05/23/2015  . PNEUMOCOCCAL POLYSACCHARIDE VACCINE (2) 06/02/2015  . URINE MICROALBUMIN  07/06/2015  . TETANUS/TDAP  07/06/2025   Immunization History  Administered Date(s) Administered  . PPD Test 07/05/2014, 07/07/2015  . Pneumococcal Polysaccharide-23 06/01/2010  . Td 10/22/2004  . Tdap 07/07/2015   No past surgical history on file. No family history on file. Social History  Substance Use Topics  . Smoking status: Never Smoker   . Smokeless tobacco: Never Used  . Alcohol Use: No     Comment: Social    ROS Constitutional: Denies fever, chills, weight loss/gain, headaches, insomnia,  night sweats, and change in appetite. Does c/o fatigue. Eyes: Denies redness, blurred vision, diplopia, discharge, itchy, watery eyes.  ENT: Denies discharge, congestion, post nasal drip, epistaxis, sore throat, earache, hearing loss, dental pain, Tinnitus, Vertigo, Sinus pain, snoring.  Cardio: Denies chest pain, palpitations, irregular heartbeat,  syncope, dyspnea, diaphoresis, orthopnea, PND, claudication, edema Respiratory: denies cough, dyspnea, DOE, pleurisy, hoarseness, laryngitis, wheezing.  Gastrointestinal: Denies dysphagia, heartburn, reflux, water brash, pain, cramps, nausea, vomiting, bloating, diarrhea, constipation, hematemesis, melena, hematochezia, jaundice, hemorrhoids Genitourinary: Denies dysuria, frequency, urgency, nocturia, hesitancy, discharge, hematuria, flank pain Breast: Breast lumps, nipple discharge, bleeding.  Musculoskeletal: Denies arthralgia, myalgia, stiffness, Jt. Swelling, pain, limp, and strain/sprain. Denies falls. Skin: Denies puritis, rash, hives, warts, acne, eczema, changing in skin lesion Neuro: No weakness, tremor, incoordination, spasms, paresthesia, pain Psychiatric: Denies confusion, memory loss, sensory loss. Denies Depression. Endocrine: Denies change in weight, skin, hair change, nocturia, and paresthesia, diabetic polys, visual blurring, hyper / hypo glycemic episodes.  Heme/Lymph: No excessive bleeding, bruising, enlarged lymph nodes.  Physical Exam  BP 116/82 mmHg  Pulse 100  Temp(Src) 98.1 F (36.7 C)  Resp 18  Ht 5' (1.524 m)  Wt 131 lb (59.421 kg)  BMI 25.58 kg/m2  General Appearance: Well nourished and in no apparent distress. Eyes: PERRLA, EOMs, conjunctiva no swelling or erythema, normal fundi and vessels. Sinuses: No frontal/maxillary tenderness ENT/Mouth: EACs patent / TMs  nl. Nares clear without  erythema, swelling, mucoid exudates. Oral hygiene is good. No erythema, swelling, or exudate. Tongue normal, non-obstructing. Tonsils not swollen or erythematous. Hearing normal.  Neck: Supple, thyroid normal. No bruits, nodes or JVD. Respiratory: Respiratory effort normal.  BS equal and clear bilateral without rales, rhonci, wheezing or stridor. Cardio: Heart sounds are normal with regular rate and rhythm and no murmurs, rubs or gallops. Peripheral pulses are normal and equal  bilaterally without edema. No aortic or femoral bruits. Chest: symmetric with normal excursions and percussion. Breasts: Symmetric, without lumps, nipple discharge, retractions, or fibrocystic changes.  Abdomen: Flat, soft, with bowel sounds. Nontender, no guarding, rebound, hernias, masses, or organomegaly.  Lymphatics: Non tender without lymphadenopathy.  Musculoskeletal: Full ROM all peripheral extremities, joint stability, 5/5 strength, and normal gait. Skin: Warm and dry without rashes, lesions, cyanosis, clubbing or  ecchymosis.  Neuro: Cranial nerves intact, reflexes equal bilaterally. Normal muscle tone, no cerebellar symptoms. Sensation intact.  Pysch: Awake and oriented X 3, normal affect, Insight and Judgment appropriate.   Assessment and Plan  1. Wellness examination   2. Elevated BP  - Microalbumin / creatinine urine ratio - EKG 12-Lead  3. Elevated cholesterol  - Lipid panel  4. Insulin resistance, Hx  - Hemoglobin A1c - Insulin, random  5. Vitamin D deficiency  - Vit D  25 hydroxy   6. Screening for rectal cancer  - POC Hemoccult Bld/Stl   7. Other fatigue  - Vitamin B12 - Iron and TIBC - TSH  8. BMI 25.0-25.9,adult   9. Medication management  - Urinalysis, Routine w reflex microscopic  - CBC with Differential/Platelet - BASIC METABOLIC PANEL WITH GFR - Hepatic function panel - Magnesium  10. Screening examination for pulmonary tuberculosis  - PPD  11. Need for prophylactic vaccination with combined diphtheria-tetanus-pertussis (DTP) vaccine  - Tdap vaccine greater than or equal to 7yo IM   Continue prudent diet as discussed, weight control, BP monitoring, regular exercise, and medications. Discussed med's effects and SE's. Screening labs and tests as requested with regular follow-up as recommended.  Over 40 minutes of exam, counseling, chart review was performed.

## 2015-07-08 LAB — URINALYSIS, ROUTINE W REFLEX MICROSCOPIC
Bilirubin Urine: NEGATIVE
Glucose, UA: NEGATIVE
Ketones, ur: NEGATIVE
Leukocytes, UA: NEGATIVE
Nitrite: NEGATIVE
Protein, ur: NEGATIVE
Specific Gravity, Urine: 1.008 (ref 1.001–1.035)
pH: 6.5 (ref 5.0–8.0)

## 2015-07-08 LAB — MICROALBUMIN / CREATININE URINE RATIO
Creatinine, Urine: 35 mg/dL
Microalb, Ur: <0.2 mg/dL (ref <2.0)

## 2015-07-08 LAB — URINALYSIS, MICROSCOPIC ONLY
Bacteria, UA: NONE SEEN [HPF]
Casts: NONE SEEN [LPF]
Crystals: NONE SEEN [HPF]
RBC / HPF: NONE SEEN RBC/HPF (ref <=2)
Squamous Epithelial / LPF: NONE SEEN [HPF] (ref <=5)
WBC, UA: NONE SEEN WBC/HPF (ref <=5)
Yeast: NONE SEEN [HPF]

## 2015-07-08 LAB — VITAMIN D 25 HYDROXY (VIT D DEFICIENCY, FRACTURES): Vit D, 25-Hydroxy: 56 ng/mL (ref 30–100)

## 2015-07-08 LAB — HEMOGLOBIN A1C
Hgb A1c MFr Bld: 5.3 % (ref <5.7)
Mean Plasma Glucose: 105 mg/dL (ref <117)

## 2015-07-08 LAB — TSH: TSH: 2.254 u[IU]/mL (ref 0.350–4.500)

## 2015-07-08 LAB — VITAMIN B12: Vitamin B-12: 596 pg/mL (ref 211–911)

## 2015-07-08 LAB — INSULIN, RANDOM: Insulin: 8.7 u[IU]/mL (ref 2.0–19.6)

## 2016-05-24 ENCOUNTER — Other Ambulatory Visit: Payer: Self-pay | Admitting: Obstetrics and Gynecology

## 2016-05-24 DIAGNOSIS — R928 Other abnormal and inconclusive findings on diagnostic imaging of breast: Secondary | ICD-10-CM

## 2016-05-28 ENCOUNTER — Ambulatory Visit
Admission: RE | Admit: 2016-05-28 | Discharge: 2016-05-28 | Disposition: A | Discharge status: Home / Self Care | Payer: BLUE CROSS/BLUE SHIELD | Source: Ambulatory Visit | Attending: Obstetrics and Gynecology | Admitting: Obstetrics and Gynecology

## 2016-05-28 DIAGNOSIS — R928 Other abnormal and inconclusive findings on diagnostic imaging of breast: Secondary | ICD-10-CM

## 2016-08-06 ENCOUNTER — Encounter: Payer: Self-pay | Admitting: Internal Medicine

## 2016-08-06 ENCOUNTER — Ambulatory Visit (INDEPENDENT_AMBULATORY_CARE_PROVIDER_SITE_OTHER): Payer: BLUE CROSS/BLUE SHIELD | Admitting: Internal Medicine

## 2016-08-06 VITALS — BP 122/80 | HR 76 | Temp 97.5°F | Resp 16 | Ht 60.0 in | Wt 133.4 lb

## 2016-08-06 DIAGNOSIS — E8881 Metabolic syndrome: Secondary | ICD-10-CM

## 2016-08-06 DIAGNOSIS — Z0001 Encounter for general adult medical examination with abnormal findings: Secondary | ICD-10-CM

## 2016-08-06 DIAGNOSIS — Z0000 Encounter for general adult medical examination without abnormal findings: Secondary | ICD-10-CM

## 2016-08-06 DIAGNOSIS — Z79899 Other long term (current) drug therapy: Secondary | ICD-10-CM

## 2016-08-06 DIAGNOSIS — R030 Elevated blood-pressure reading, without diagnosis of hypertension: Secondary | ICD-10-CM

## 2016-08-06 DIAGNOSIS — Z111 Encounter for screening for respiratory tuberculosis: Secondary | ICD-10-CM | POA: Diagnosis not present

## 2016-08-06 DIAGNOSIS — Z1212 Encounter for screening for malignant neoplasm of rectum: Secondary | ICD-10-CM

## 2016-08-06 DIAGNOSIS — R5383 Other fatigue: Secondary | ICD-10-CM

## 2016-08-06 DIAGNOSIS — E7800 Pure hypercholesterolemia, unspecified: Secondary | ICD-10-CM

## 2016-08-06 DIAGNOSIS — E559 Vitamin D deficiency, unspecified: Secondary | ICD-10-CM

## 2016-08-06 DIAGNOSIS — E88819 Insulin resistance, unspecified: Secondary | ICD-10-CM

## 2016-08-06 LAB — CBC WITH DIFFERENTIAL/PLATELET
Basophils Absolute: 84 cells/uL (ref 0–200)
Basophils Relative: 1 %
Eosinophils Absolute: 168 cells/uL (ref 15–500)
Eosinophils Relative: 2 %
HCT: 41 % (ref 35.0–45.0)
Hemoglobin: 13.3 g/dL (ref 11.7–15.5)
Lymphocytes Relative: 30 %
Lymphs Abs: 2520 cells/uL (ref 850–3900)
MCH: 29.6 pg (ref 27.0–33.0)
MCHC: 32.4 g/dL (ref 32.0–36.0)
MCV: 91.1 fL (ref 80.0–100.0)
MPV: 9.8 fL (ref 7.5–12.5)
Monocytes Absolute: 588 cells/uL (ref 200–950)
Monocytes Relative: 7 %
Neutro Abs: 5040 cells/uL (ref 1500–7800)
Neutrophils Relative %: 60 %
Platelets: 342 10*3/uL (ref 140–400)
RBC: 4.5 MIL/uL (ref 3.80–5.10)
RDW: 13.5 % (ref 11.0–15.0)
WBC: 8.4 10*3/uL (ref 3.8–10.8)

## 2016-08-06 LAB — IRON AND TIBC
%SAT: 15 % (ref 11–50)
Iron: 53 ug/dL (ref 40–190)
TIBC: 347 ug/dL (ref 250–450)
UIBC: 294 ug/dL (ref 125–400)

## 2016-08-06 LAB — BASIC METABOLIC PANEL WITH GFR
BUN: 13 mg/dL (ref 7–25)
CO2: 22 mmol/L (ref 20–31)
Calcium: 9.3 mg/dL (ref 8.6–10.2)
Chloride: 103 mmol/L (ref 98–110)
Creat: 0.68 mg/dL (ref 0.50–1.10)
GFR, Est African American: >89 mL/min (ref >=60)
GFR, Est Non African American: >89 mL/min (ref >=60)
Glucose, Bld: 83 mg/dL (ref 65–99)
Potassium: 3.9 mmol/L (ref 3.5–5.3)
Sodium: 139 mmol/L (ref 135–146)

## 2016-08-06 LAB — HEPATIC FUNCTION PANEL
ALT: 16 U/L (ref 6–29)
AST: 19 U/L (ref 10–35)
Albumin: 4.3 g/dL (ref 3.6–5.1)
Alkaline Phosphatase: 61 U/L (ref 33–115)
Bilirubin, Direct: 0.1 mg/dL (ref <=0.2)
Indirect Bilirubin: 0.2 mg/dL (ref 0.2–1.2)
Total Bilirubin: 0.3 mg/dL (ref 0.2–1.2)
Total Protein: 7.6 g/dL (ref 6.1–8.1)

## 2016-08-06 LAB — TSH: TSH: 1.56 mIU/L

## 2016-08-06 LAB — LIPID PANEL
Cholesterol: 187 mg/dL (ref 125–200)
HDL: 79 mg/dL (ref >=46)
LDL Cholesterol: 88 mg/dL (ref <130)
Total CHOL/HDL Ratio: 2.4 Ratio (ref <=5.0)
Triglycerides: 98 mg/dL (ref <150)
VLDL: 20 mg/dL (ref <30)

## 2016-08-06 LAB — MAGNESIUM: Magnesium: 2.1 mg/dL (ref 1.5–2.5)

## 2016-08-06 MED ORDER — ASPIRIN EC 81 MG PO TBEC: 81.0000 mg | DELAYED_RELEASE_TABLET | Freq: Every day | ORAL | Status: AC

## 2016-08-06 NOTE — Patient Instructions (Signed)

## 2016-08-06 NOTE — Progress Notes (Signed)
[facility] Unk Pinto, M.D.    Uvaldo Bristle. Silverio Lay, P.A.-C      Starlyn Skeans, P.A.-C  [facility]                [address] [place], [place] [id number] Telephone [phone] Telefax [phone]  Annual Screening/Preventative Visit & Comprehensive Evaluation &  Examination     This very nice 50 y.o. MWF presents for a Screening/Preventative Visit & comprehensive evaluation and management of multiple medical co-morbidities.  Patient is screened for elevated BP, HTN, Prediabetes, Hyperlipidemia and Vitamin D Deficiency.      Patient is screened expectantly for elevated BP. Patient's BP has been controlled at home and patient denies any cardiac symptoms as chest pain, palpitations, shortness of breath, dizziness or ankle swelling. Today's BP is 122/80.      Patient's hyperlipidemia is controlled with diet.  Last lipids were at goal: Lab Results  Component Value Date   CHOL 198 07/07/2015   HDL 83 07/07/2015   LDLCALC 95 07/07/2015   TRIG 102 07/07/2015   CHOLHDL 2.4 07/07/2015      Patient has hx/o gestational [phone]) and patient denies reactive hypoglycemic symptoms, visual blurring, diabetic polys, or paresthesias. In 2013 , she did have Nl A1c 5.4% w/concommitant elev Insulin 39 suspect for Insulin Resistance. Last A1c was at goal: Lab Results  Component Value Date   HGBA1C 5.3 07/07/2015      Finally, patient has history of Vitamin D Deficiency of "35" in 2009 and last Vitamin D was improved: Lab Results  Component Value Date   VD25OH 58 07/07/2015   Current Outpatient Prescriptions on File Prior to Visit  Medication Sig   bASA 81 mg  Take 1 tablet daily  . cetirizine (ZYRTEC) 10 MG tablet Take 10 mg by mouth daily. OTC  . Cholecalciferol (VITAMIN D PO) Take 2,000 Int'l Units by mouth daily.    No Known Allergies   Health Maintenance  Topic Date Due  . FOOT EXAM   11/03/1977  . OPHTHALMOLOGY EXAM  11/03/1977  . HIV Screening  11/03/1982  . PAP SMEAR  11/03/1988  . PNEUMOCOCCAL POLYSACCHARIDE VACCINE (2) 06/02/2015  . HEMOGLOBIN A1C  01/04/2016  . INFLUENZA VACCINE  05/22/2016  . URINE MICROALBUMIN  07/06/2016  . TETANUS/TDAP  07/06/2025   Immunization History  Administered Date(s) Administered  . Influenza-Unspecified 08/01/2016  . PPD Test 07/05/2014, 07/07/2015, 08/06/2016  . Pneumococcal Polysaccharide-23 06/01/2010  . Td 10/22/2004  . Tdap 07/07/2015   History reviewed. No pertinent surgical history.   History reviewed. No pertinent family history.   Social History  Substance Use Topics  . Smoking status: Never Smoker  . Smokeless tobacco: Never Used  . Alcohol use No     Comment: Social    ROS Constitutional: Denies fever, chills, weight loss/gain, headaches, insomnia,  night sweats, and change in appetite. Does c/o fatigue. Eyes: Denies redness, blurred vision, diplopia, discharge, itchy, watery eyes.  ENT: Denies discharge, congestion, post nasal drip, epistaxis, sore throat, earache, hearing loss, dental pain, Tinnitus, Vertigo, Sinus pain, snoring.  Cardio: Denies chest pain, palpitations, irregular heartbeat, syncope, dyspnea, diaphoresis, orthopnea, PND, claudication, edema Respiratory: denies cough, dyspnea, DOE, pleurisy, hoarseness, laryngitis, wheezing.  Gastrointestinal: Denies dysphagia, heartburn, reflux, water brash, pain, cramps, nausea, vomiting, bloating, diarrhea, constipation, hematemesis, melena, hematochezia, jaundice, hemorrhoids Genitourinary: Denies dysuria, frequency, urgency, nocturia, hesitancy, discharge, hematuria,  flank pain Breast: Breast lumps, nipple discharge, bleeding.  Musculoskeletal: Denies arthralgia, myalgia, stiffness, Jt. Swelling, pain, limp, and strain/sprain. Denies falls. Does c/o recent discomfort at the base of the R index finger. Skin: Denies puritis, rash, hives, warts, acne, eczema,  changing in skin lesion Neuro: No weakness, tremor, incoordination, spasms, paresthesia, pain Psychiatric: Denies confusion, memory loss, sensory loss. Denies Depression. Endocrine: Denies change in weight, skin, hair change, nocturia, and paresthesia, diabetic polys, visual blurring, hyper / hypo glycemic episodes.  Heme/Lymph: No excessive bleeding, bruising, enlarged lymph nodes.  Physical Exam  BP 122/80   Pulse 76   Temp 97.5 F (36.4 C)   Resp 16   Ht 5' (1.524 m)   Wt 133 lb 6.4 oz (60.5 kg)   BMI 26.05 kg/m   General Appearance: Well nourished and in no apparent distress.  Eyes: PERRLA, EOMs, conjunctiva no swelling or erythema, normal fundi and vessels. Sinuses: No frontal/maxillary tenderness ENT/Mouth: EACs patent / TMs  nl. Nares clear without erythema, swelling, mucoid exudates. Oral hygiene is good. No erythema, swelling, or exudate. Tongue normal, non-obstructing. Tonsils not swollen or erythematous. Hearing normal.  Neck: Supple, thyroid normal. No bruits, nodes or JVD. Respiratory: Respiratory effort normal.  BS equal and clear bilateral without rales, rhonci, wheezing or stridor. Cardio: Heart sounds are normal with regular rate and rhythm and no murmurs, rubs or gallops. Peripheral pulses are normal and equal bilaterally without edema. No aortic or femoral bruits. Chest: symmetric with normal excursions and percussion. Breasts: Deferred to GYN Abdomen: Flat, soft with bowel sounds active. Nontender, no guarding, rebound, hernias, masses, or organomegaly.  Lymphatics: Non tender without lymphadenopathy.  Musculoskeletal: Full ROM all peripheral extremities, joint stability, 5/5 strength, and normal gait. Skin: Warm and dry without rashes, lesions, cyanosis, clubbing or  ecchymosis.  Neuro: Cranial nerves intact, reflexes equal bilaterally. Normal muscle tone, no cerebellar symptoms. Sensation intact.  Pysch: Alert and oriented X 3, normal affect, Insight and  Judgment appropriate.   Assessment and Plan  1. Annual Preventative Screening Examination   2. Elevated BP without diagnosis of hypertension  - Microalbumin / creatinine urine ratio - TSH  3. Elevated cholesterol  - Lipid panel - TSH  4. Insulin resistance, Hx  - Hemoglobin A1c - Insulin, random  5. Vitamin D deficiency  - VITAMIN D 25 Hydroxy   6. Screening for rectal cancer  - POC Hemoccult Bld/Stl   7. Other fatigue  - Vitamin B12 - Iron and TIBC  8. Medication management  - Urinalysis, Routine w reflex microscopic  - CBC with Differential/Platelet - BASIC METABOLIC PANEL WITH GFR - Hepatic function panel - Magnesium  9. Screening examination for pulmonary tuberculosis  - PPD     Continue prudent diet as discussed, weight control, BP monitoring, regular exercise, and medications. Discussed med's effects and SE's. Screening labs and tests as requested with regular follow-up as recommended. Over 40 minutes of exam, counseling, chart review and high complex critical decision making was performed.

## 2016-08-07 LAB — URINALYSIS, MICROSCOPIC ONLY
Bacteria, UA: NONE SEEN [HPF]
Casts: NONE SEEN [LPF]
Crystals: NONE SEEN [HPF]
Squamous Epithelial / LPF: NONE SEEN [HPF] (ref <=5)
WBC, UA: NONE SEEN WBC/HPF (ref <=5)
Yeast: NONE SEEN [HPF]

## 2016-08-07 LAB — URINALYSIS, ROUTINE W REFLEX MICROSCOPIC
Bilirubin Urine: NEGATIVE
Glucose, UA: NEGATIVE
Ketones, ur: NEGATIVE
Leukocytes, UA: NEGATIVE
Nitrite: NEGATIVE
Protein, ur: NEGATIVE
Specific Gravity, Urine: 1.016 (ref 1.001–1.035)
pH: 6.5 (ref 5.0–8.0)

## 2016-08-07 LAB — HEMOGLOBIN A1C
Hgb A1c MFr Bld: 4.9 % (ref <5.7)
Mean Plasma Glucose: 94 mg/dL

## 2016-08-07 LAB — INSULIN, RANDOM: Insulin: 6.3 u[IU]/mL (ref 2.0–19.6)

## 2016-08-07 LAB — VITAMIN B12: Vitamin B-12: 301 pg/mL (ref 200–1100)

## 2016-08-07 LAB — MICROALBUMIN / CREATININE URINE RATIO
Creatinine, Urine: 83 mg/dL (ref 20–320)
Microalb Creat Ratio: 4 mcg/mg creat (ref <30)
Microalb, Ur: 0.3 mg/dL

## 2016-08-07 LAB — VITAMIN D 25 HYDROXY (VIT D DEFICIENCY, FRACTURES): Vit D, 25-Hydroxy: 45 ng/mL (ref 30–100)

## 2017-02-19 HISTORY — PX: ARTHROSCOPIC REPAIR ACL: SUR80

## 2017-02-19 HISTORY — PX: ANTERIOR CRUCIATE LIGAMENT REPAIR: SHX115

## 2017-08-26 ENCOUNTER — Encounter: Payer: Self-pay | Admitting: Adult Health

## 2017-08-26 NOTE — Progress Notes (Signed)
Complete Physical  Assessment and Plan:  Tricia Berger was seen today for annual exam.  Diagnoses and all orders for this visit:  Encounter for routine adult health examination with abnormal findings  Insulin resistance, Hx -     Continue healthy diet/exercise, weight maintenance -     Hemoglobin A1c -     Insulin, random  Vitamin D deficiency -     VITAMIN D 25 Hydroxy (Vit-D Deficiency, Fractures)  Medication management -     CBC with Differential/Platelet -     BASIC METABOLIC PANEL WITH GFR -     Hepatic function panel -     Magnesium  Screening for cardiovascular condition -     Lipid panel -     EKG 12-Lead  Screening for colon cancer -     POC Hemoccult Bld/Stl (3-Cd Home Screen); Future  Screening for deficiency anemia -     Iron,Total/Total Iron Binding Cap -     Vitamin B12  Screening for hematuria or proteinuria -     Urinalysis, Complete (81001)  Screening for thyroid disorder -     TSH  Discussed med's effects and SE's. Screening labs and tests as requested with regular follow-up as recommended. Over 40 minutes of exam, counseling, chart review, and complex, high level critical decision making was performed this visit.   Future Appointments  Date Time Provider [place]  08/28/2018  3:00 PM Liane Comber, NP [facility] None    HPI  51 y.o. very healthy married female of two grown boys with new twin granddaughters, property Optometrist, presents for a complete physical and follow up for has Insulin resistance, Hx; Vitamin D deficiency; and Medication management on their problem list.. She is following up from L knee ACL recontruction with menisectomy a few months ago still being followed by PT and doing very well. Denies other concerns, states she is doing very well. She is followed annually by GYN and performs monthly breast checks.   BMI is Body mass index is 25.43 kg/m., she has been working on diet and exercise. Wt Readings from Last 3  Encounters:  08/27/17 134 lb 9.6 oz (61.1 kg)  08/06/16 133 lb 6.4 oz (60.5 kg)  07/07/15 131 lb (59.4 kg)   Her blood pressure today is BP: 122/80 She does workout. She denies chest pain, shortness of breath, dizziness.   She is not on cholesterol medication and denies myalgias. Her cholesterol is at goal. The cholesterol last visit was:   Lab Results  Component Value Date   CHOL 187 08/06/2016   HDL 79 08/06/2016   LDLCALC 88 08/06/2016   TRIG 98 08/06/2016   CHOLHDL 2.4 08/06/2016   She has been working on diet and exercise for history of insulin resistance. Last A1C in the office was:  Lab Results  Component Value Date   HGBA1C 4.9 08/06/2016   Last GFR: Lab Results  Component Value Date   GFRNONAA >89 08/06/2016   Patient is on Vitamin D supplement but below goal- she has since increased the dose:   Lab Results  Component Value Date   VD25OH 45 08/06/2016      Current Medications:  Current Outpatient Medications on File Prior to Visit  Medication Sig Dispense Refill  . cetirizine (ZYRTEC) 10 MG tablet Take 10 mg by mouth daily. OTC    . Cholecalciferol (VITAMIN D PO) Take 2,000 Int'l Units by mouth daily.     . TURMERIC PO Take 1 tablet by mouth daily.  No current facility-administered medications on file prior to visit.    Allergies:  No Known Allergies Medical History:  She has Insulin resistance, Hx; Vitamin D deficiency; and Medication management on their problem list. Health Maintenance:   Immunization History  Administered Date(s) Administered  . Influenza-Unspecified 08/01/2016, 08/07/2017  . PPD Test 07/05/2014, 07/07/2015, 08/06/2016  . Pneumococcal Polysaccharide-23 06/01/2010  . Td 10/22/2004  . Tdap 07/07/2015    Tetanus: 2016 Flu vaccine: 2018 Shingrix: n/a LMP: Patient's last menstrual period was 08/20/2017. Pap: 05/2017 - by GYN Dr. Ouida Sills MGM: 2018 by GYN DEXA: n/a Colonoscopy: n/a EGD: n/a  Last Dental Exam: 03/2017 q 6  months Last Eye Exam: 06/2017 no issues, goes annually Last Derm Exam: goes q6 months, no concerning lesions  Patient Care Team: Unk Pinto, MD as PCP - General (Internal Medicine) Deirdre Pippins, PA-C as Physician Assistant (Internal Medicine)  Surgical History:  She has a past surgical history that includes Anterior cruciate ligament repair (Left, 02/2017). Family History:  Herfamily history includes Breast cancer in her maternal grandmother; Diabetes type II in her maternal grandfather; Heart disease in her maternal grandfather and maternal grandmother; Hypertension in her mother. Social History:  She reports that  has never smoked. she has never used smokeless tobacco. She reports that she does not drink alcohol or use drugs.  Review of Systems: Review of Systems  Constitutional: Negative for malaise/fatigue and weight loss.  HENT: Negative for hearing loss and tinnitus.   Eyes: Negative for blurred vision and double vision.  Respiratory: Negative for cough, shortness of breath and wheezing.   Cardiovascular: Negative for chest pain, palpitations, orthopnea, claudication and leg swelling.  Gastrointestinal: Negative for abdominal pain, blood in stool, constipation, diarrhea, heartburn, melena, nausea and vomiting.  Genitourinary: Negative.   Musculoskeletal: Negative for joint pain and myalgias.  Skin: Negative for rash.  Neurological: Negative for dizziness, tingling, sensory change, weakness and headaches.  Endo/Heme/Allergies: Negative for polydipsia.  Psychiatric/Behavioral: Negative.   All other systems reviewed and are negative.   Physical Exam: Estimated body mass index is 25.43 kg/m as calculated from the following:   Height as of this encounter: 5\' 1"  (1.549 m).   Weight as of this encounter: 134 lb 9.6 oz (61.1 kg). BP 122/80   Pulse 90   Temp 97.8 F (36.6 C)   Ht 5\' 1"  (1.549 m)   Wt 134 lb 9.6 oz (61.1 kg)   LMP 08/20/2017   SpO2 97%   BMI 25.43  kg/m  General Appearance: Well nourished, in no apparent distress.  Eyes: PERRLA, EOMs, conjunctiva no swelling or erythema, normal fundi and vessels.  Sinuses: No Frontal/maxillary tenderness  ENT/Mouth: Ext aud canals clear, normal light reflex with TMs without erythema, bulging. Good dentition. No erythema, swelling, or exudate on post pharynx. Tonsils not swollen or erythematous. Hearing normal.  Neck: Supple, thyroid normal. No bruits  Respiratory: Respiratory effort normal, BS equal bilaterally without rales, rhonchi, wheezing or stridor.  Cardio: RRR without murmurs, rubs or gallops. Brisk peripheral pulses without edema.  Chest: symmetric, with normal excursions and percussion.  Breasts: defer to GYN Abdomen: Soft, nontender, no guarding, rebound, hernias, masses, or organomegaly.  Lymphatics: Non tender without lymphadenopathy.  Genitourinary: defer to GYN Musculoskeletal: Full ROM all peripheral extremities,5/5 strength, and normal gait.  Skin: Warm, dry without rashes, lesions, ecchymosis. Neuro: Cranial nerves intact, reflexes equal bilaterally. Normal muscle tone, no cerebellar symptoms. Sensation intact.  Psych: Awake and oriented X 3, normal affect, Insight and Judgment appropriate.  EKG: WNL no ST changes.  Izora Ribas 3:36 PM [facility]

## 2017-08-27 ENCOUNTER — Encounter: Payer: Self-pay | Admitting: Adult Health

## 2017-08-27 ENCOUNTER — Ambulatory Visit (INDEPENDENT_AMBULATORY_CARE_PROVIDER_SITE_OTHER): Payer: BLUE CROSS/BLUE SHIELD | Admitting: Adult Health

## 2017-08-27 VITALS — BP 122/80 | HR 90 | Temp 97.8°F | Ht 61.0 in | Wt 134.6 lb

## 2017-08-27 DIAGNOSIS — Z0000 Encounter for general adult medical examination without abnormal findings: Secondary | ICD-10-CM | POA: Diagnosis not present

## 2017-08-27 DIAGNOSIS — Z1389 Encounter for screening for other disorder: Secondary | ICD-10-CM

## 2017-08-27 DIAGNOSIS — I10 Essential (primary) hypertension: Secondary | ICD-10-CM

## 2017-08-27 DIAGNOSIS — Z136 Encounter for screening for cardiovascular disorders: Secondary | ICD-10-CM | POA: Diagnosis not present

## 2017-08-27 DIAGNOSIS — Z79899 Other long term (current) drug therapy: Secondary | ICD-10-CM | POA: Diagnosis not present

## 2017-08-27 DIAGNOSIS — Z1329 Encounter for screening for other suspected endocrine disorder: Secondary | ICD-10-CM

## 2017-08-27 DIAGNOSIS — Z130 Encounter for screening for diseases of the blood and blood-forming organs and certain disorders involving the immune mechanism: Secondary | ICD-10-CM

## 2017-08-27 DIAGNOSIS — E8881 Metabolic syndrome: Secondary | ICD-10-CM

## 2017-08-27 DIAGNOSIS — Z0001 Encounter for general adult medical examination with abnormal findings: Secondary | ICD-10-CM

## 2017-08-27 DIAGNOSIS — E88819 Insulin resistance, unspecified: Secondary | ICD-10-CM

## 2017-08-27 DIAGNOSIS — E559 Vitamin D deficiency, unspecified: Secondary | ICD-10-CM

## 2017-08-27 DIAGNOSIS — Z1211 Encounter for screening for malignant neoplasm of colon: Secondary | ICD-10-CM

## 2017-08-27 NOTE — Patient Instructions (Signed)

## 2017-08-28 ENCOUNTER — Other Ambulatory Visit: Payer: Self-pay | Admitting: Adult Health

## 2017-08-28 DIAGNOSIS — Z1389 Encounter for screening for other disorder: Secondary | ICD-10-CM

## 2017-08-28 LAB — LIPID PANEL
Cholesterol: 217 mg/dL — ABNORMAL HIGH (ref <200)
HDL: 92 mg/dL (ref >50)
LDL Cholesterol (Calc): 106 mg/dL (calc) — ABNORMAL HIGH
Non-HDL Cholesterol (Calc): 125 mg/dL (calc) (ref <130)
Total CHOL/HDL Ratio: 2.4 (calc) (ref <5.0)
Triglycerides: 99 mg/dL (ref <150)

## 2017-08-28 LAB — MAGNESIUM: Magnesium: 2.2 mg/dL (ref 1.5–2.5)

## 2017-08-28 LAB — URINALYSIS, COMPLETE
Bacteria, UA: NONE SEEN /HPF
Bilirubin Urine: NEGATIVE
Glucose, UA: NEGATIVE
Hyaline Cast: NONE SEEN /LPF
Ketones, ur: NEGATIVE
Leukocytes, UA: NEGATIVE
Nitrite: NEGATIVE
Protein, ur: NEGATIVE
RBC / HPF: NONE SEEN /HPF (ref 0–2)
Specific Gravity, Urine: 1.016 (ref 1.001–1.03)
Squamous Epithelial / LPF: NONE SEEN /HPF (ref <=5)
WBC, UA: NONE SEEN /HPF (ref 0–5)
pH: 6 (ref 5.0–8.0)

## 2017-08-28 LAB — HEPATIC FUNCTION PANEL
AG Ratio: 1.5 (calc) (ref 1.0–2.5)
ALT: 19 U/L (ref 6–29)
AST: 19 U/L (ref 10–35)
Albumin: 4.5 g/dL (ref 3.6–5.1)
Alkaline phosphatase (APISO): 63 U/L (ref 33–115)
Bilirubin, Direct: 0 mg/dL (ref 0.0–0.2)
Globulin: 3 g/dL (calc) (ref 1.9–3.7)
Indirect Bilirubin: 0.3 mg/dL (calc) (ref 0.2–1.2)
Total Bilirubin: 0.3 mg/dL (ref 0.2–1.2)
Total Protein: 7.5 g/dL (ref 6.1–8.1)

## 2017-08-28 LAB — BASIC METABOLIC PANEL WITH GFR
BUN: 20 mg/dL (ref 7–25)
CO2: 27 mmol/L (ref 20–32)
Calcium: 9.6 mg/dL (ref 8.6–10.2)
Chloride: 103 mmol/L (ref 98–110)
Creat: 0.68 mg/dL (ref 0.50–1.10)
GFR, Est African American: 119 mL/min/{1.73_m2} (ref >=60)
GFR, Est Non African American: 103 mL/min/{1.73_m2} (ref >=60)
Glucose, Bld: 89 mg/dL (ref 65–99)
Potassium: 4.7 mmol/L (ref 3.5–5.3)
Sodium: 141 mmol/L (ref 135–146)

## 2017-08-28 LAB — CBC WITH DIFFERENTIAL/PLATELET
Basophils Absolute: 59 cells/uL (ref 0–200)
Basophils Relative: 0.5 %
Eosinophils Absolute: 165 cells/uL (ref 15–500)
Eosinophils Relative: 1.4 %
HCT: 40.7 % (ref 35.0–45.0)
Hemoglobin: 13.8 g/dL (ref 11.7–15.5)
Lymphs Abs: 3682 cells/uL (ref 850–3900)
MCH: 30.2 pg (ref 27.0–33.0)
MCHC: 33.9 g/dL (ref 32.0–36.0)
MCV: 89.1 fL (ref 80.0–100.0)
MPV: 10.5 fL (ref 7.5–12.5)
Monocytes Relative: 6.8 %
Neutro Abs: 7092 cells/uL (ref 1500–7800)
Neutrophils Relative %: 60.1 %
Platelets: 354 10*3/uL (ref 140–400)
RBC: 4.57 10*6/uL (ref 3.80–5.10)
RDW: 12.8 % (ref 11.0–15.0)
Total Lymphocyte: 31.2 %
WBC mixed population: 802 cells/uL (ref 200–950)
WBC: 11.8 10*3/uL — ABNORMAL HIGH (ref 3.8–10.8)

## 2017-08-28 LAB — HEMOGLOBIN A1C
Hgb A1c MFr Bld: 4.8 % of total Hgb (ref <5.7)
Mean Plasma Glucose: 91 (calc)
eAG (mmol/L): 5 (calc)

## 2017-08-28 LAB — VITAMIN D 25 HYDROXY (VIT D DEFICIENCY, FRACTURES): Vit D, 25-Hydroxy: 53 ng/mL (ref 30–100)

## 2017-08-28 LAB — IRON, TOTAL/TOTAL IRON BINDING CAP
%SAT: 19 % (calc) (ref 11–50)
Iron: 70 ug/dL (ref 40–190)
TIBC: 365 mcg/dL (calc) (ref 250–450)

## 2017-08-28 LAB — INSULIN, RANDOM: Insulin: 8.5 u[IU]/mL (ref 2.0–19.6)

## 2017-08-28 LAB — VITAMIN B12: Vitamin B-12: 365 pg/mL (ref 200–1100)

## 2017-08-28 LAB — TSH: TSH: 1.82 mIU/L

## 2017-08-28 NOTE — Addendum Note (Signed)
Addended by: Izora Ribas on: 08/28/2017 01:30 PM   Modules accepted: Orders

## 2017-08-29 LAB — MICROALBUMIN / CREATININE URINE RATIO
Creatinine, Urine: 54 mg/dL (ref 20–275)
Microalb Creat Ratio: 4 mcg/mg creat (ref <30)
Microalb, Ur: 0.2 mg/dL

## 2017-09-19 IMAGING — MG 2D DIGITAL DIAGNOSTIC UNILATERAL RIGHT MAMMOGRAM WITH CAD AND AD
6 series · 6 of 14 positions shown · non-contrast
Comparison: Previous exam(s).

CLINICAL DATA: Screening recall for a right breast mass.

EXAM:
2D DIGITAL DIAGNOSTIC RIGHT MAMMOGRAM WITH CAD AND ADJUNCT TOMO
ULTRASOUND RIGHT BREAST

[R CC synth-2D]
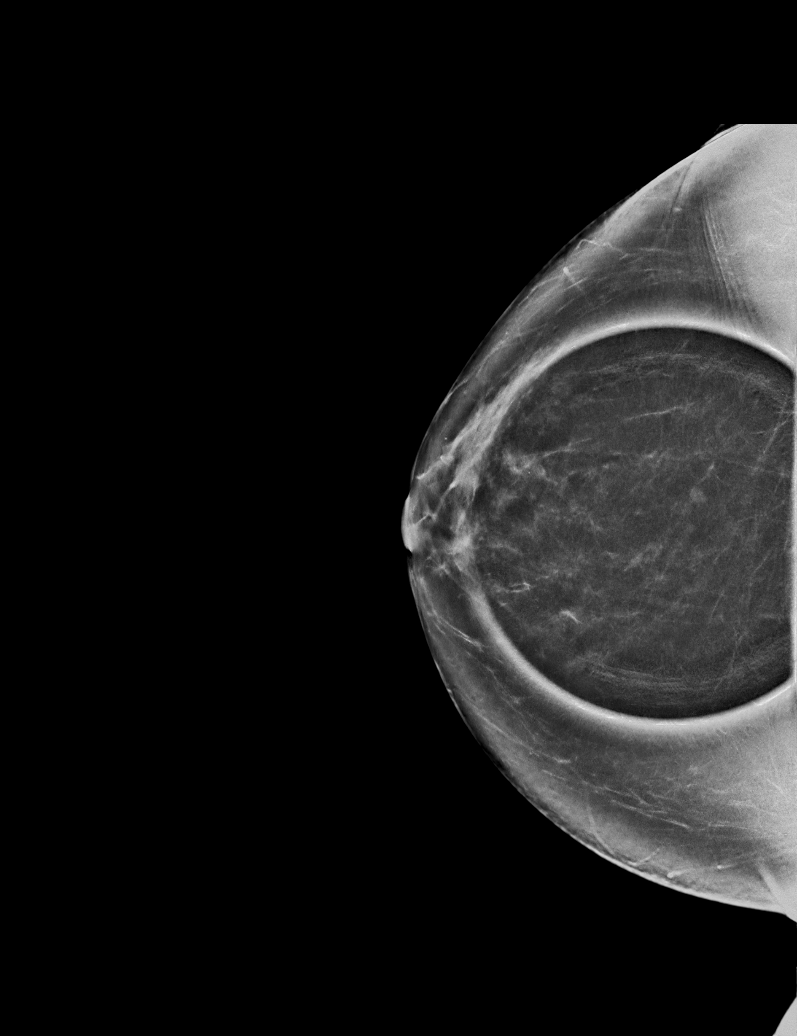

[R MLO]
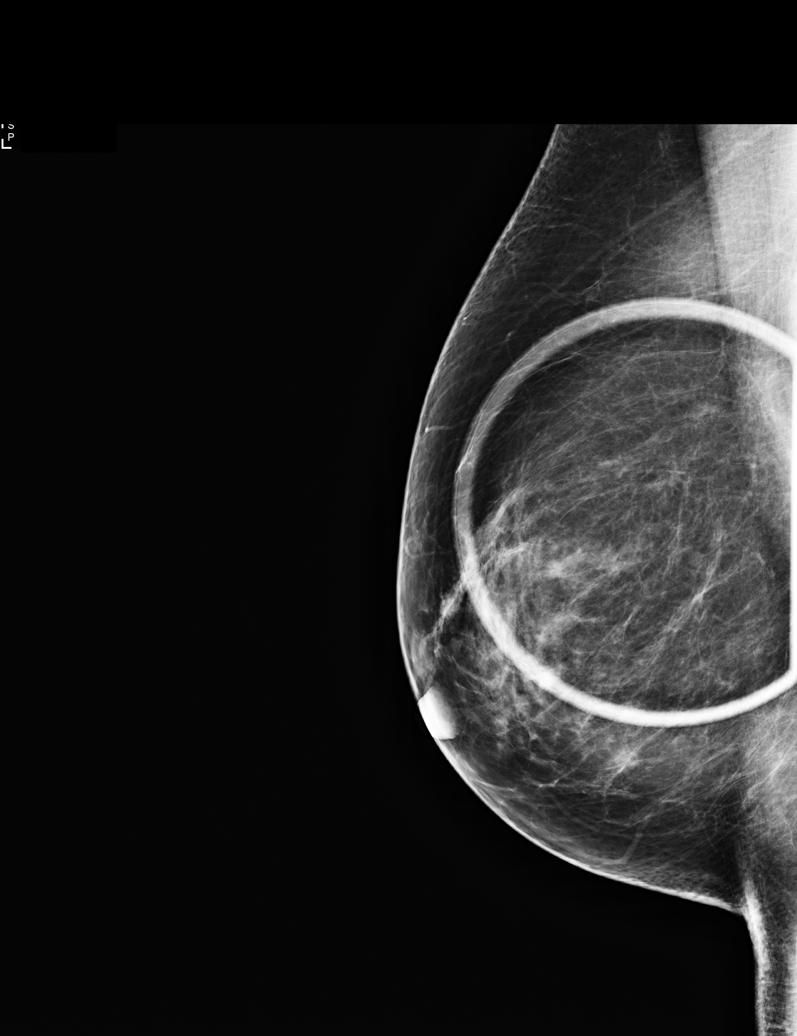

[R MLO synth-2D]
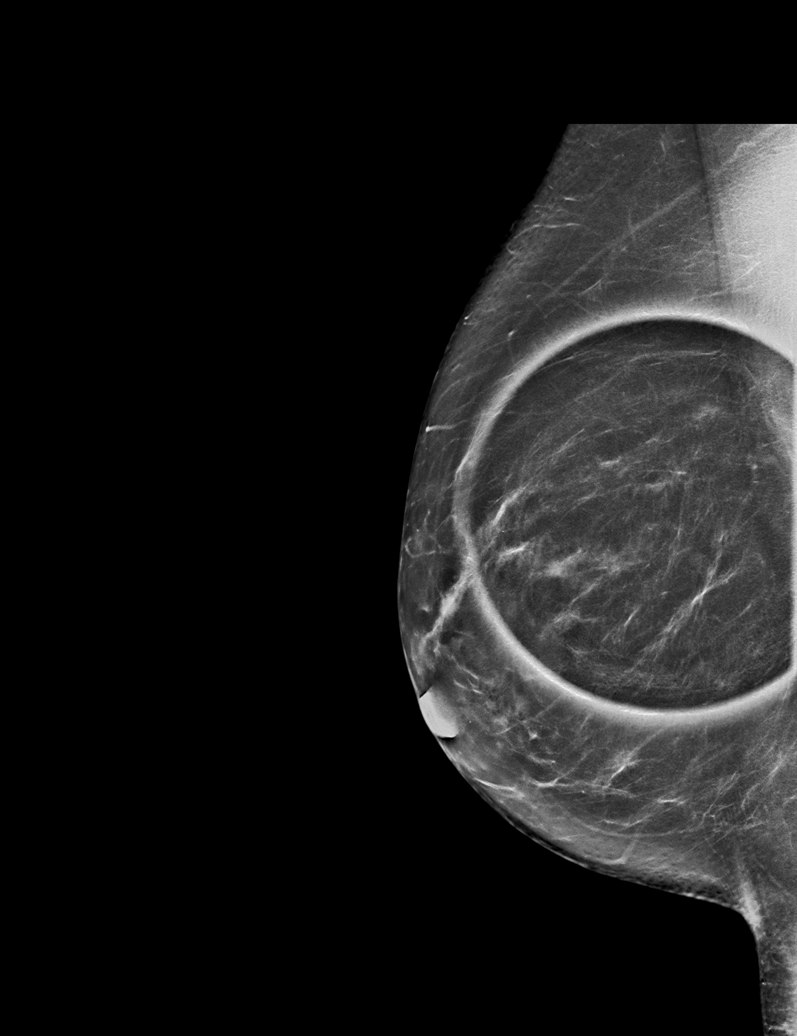

[R CC]
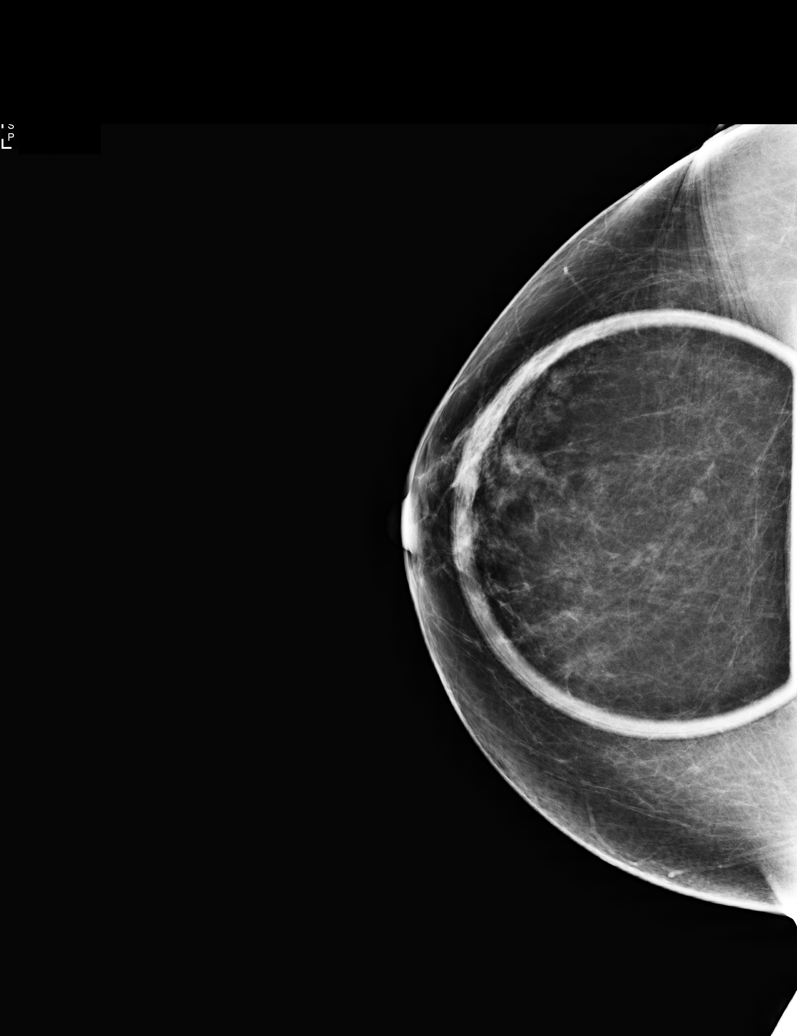

[R CC tomo · tomo slice 39/77.0]
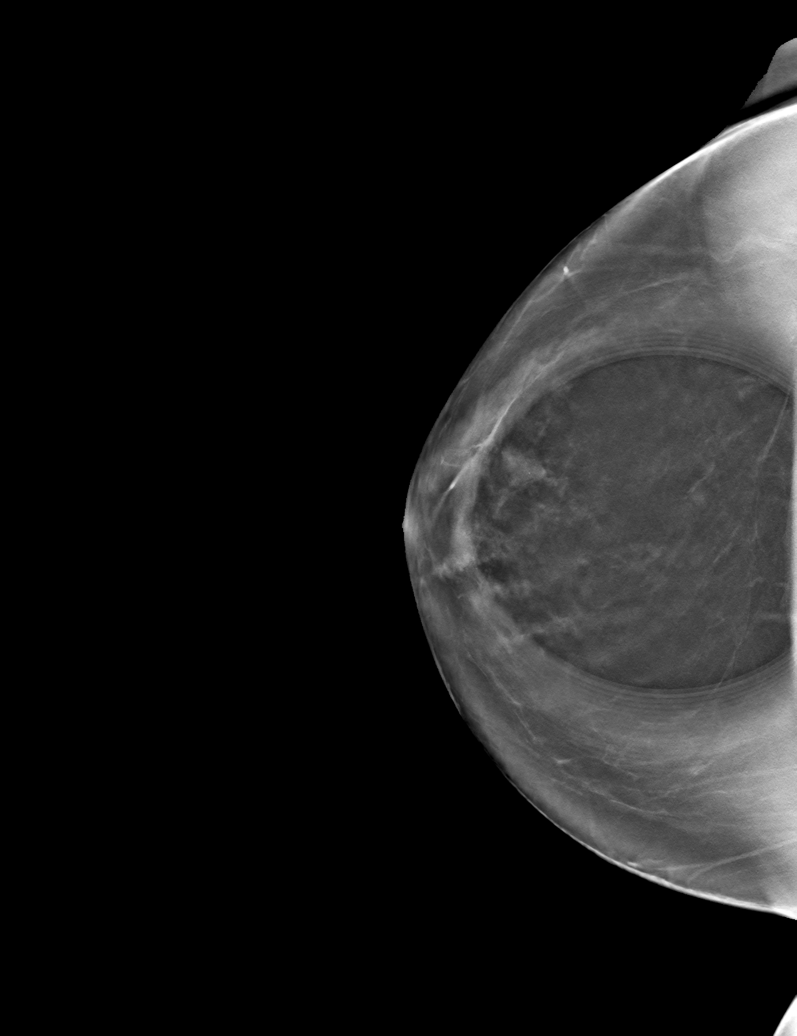

[R MLO tomo · tomo slice 37/74.0]
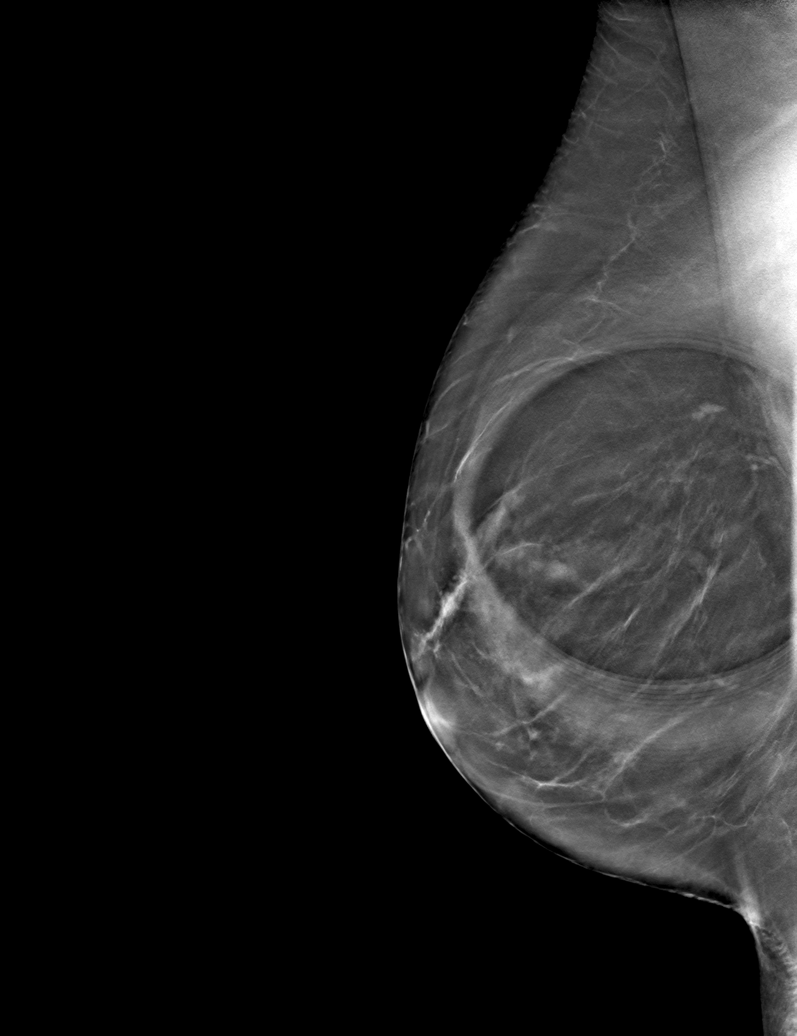

[6 of 14 positions shown; findings below may reference images not displayed]

ACR Breast Density Category b: There are scattered areas of
fibroglandular density.
FINDINGS: In the central to slightly lateral right breast, posterior depth
there is a 4 mm circumscribed oval mass. No other suspicious
calcifications, masses or areas of distortion are seen in the
bilateral breasts.

Mammographic images were processed with CAD.

Targeted ultrasound is performed, showing a circumscribed anechoic
mass at 9989, 3 cm from the nipple measuring 4 x 2 x 4 mm
IMPRESSION: The mass of concern in the right breast corresponds with a benign
cyst. No mammographic or targeted sonographic evidence of right
breast malignancy.

RECOMMENDATION:
Screening mammogram in one year.(Code:[id number]-A-7WY)

I have discussed the findings and recommendations with the patient.
Results were also provided in writing at the conclusion of the
visit. If applicable, a reminder letter will be sent to the patient
regarding the next appointment.

BI-RADS CATEGORY  2: Benign.

## 2017-09-19 IMAGING — US ULTRASOUND RIGHT BREAST LIMITED
1 series · 5 of 5 positions shown · non-contrast
Comparison: Previous exam(s).

CLINICAL DATA: Screening recall for a right breast mass.

EXAM:
2D DIGITAL DIAGNOSTIC RIGHT MAMMOGRAM WITH CAD AND ADJUNCT TOMO
ULTRASOUND RIGHT BREAST

[Series 1: ultrasound right breast limited · 0.06mm/px · 5 of 5 slices shown]
[im 1/5]
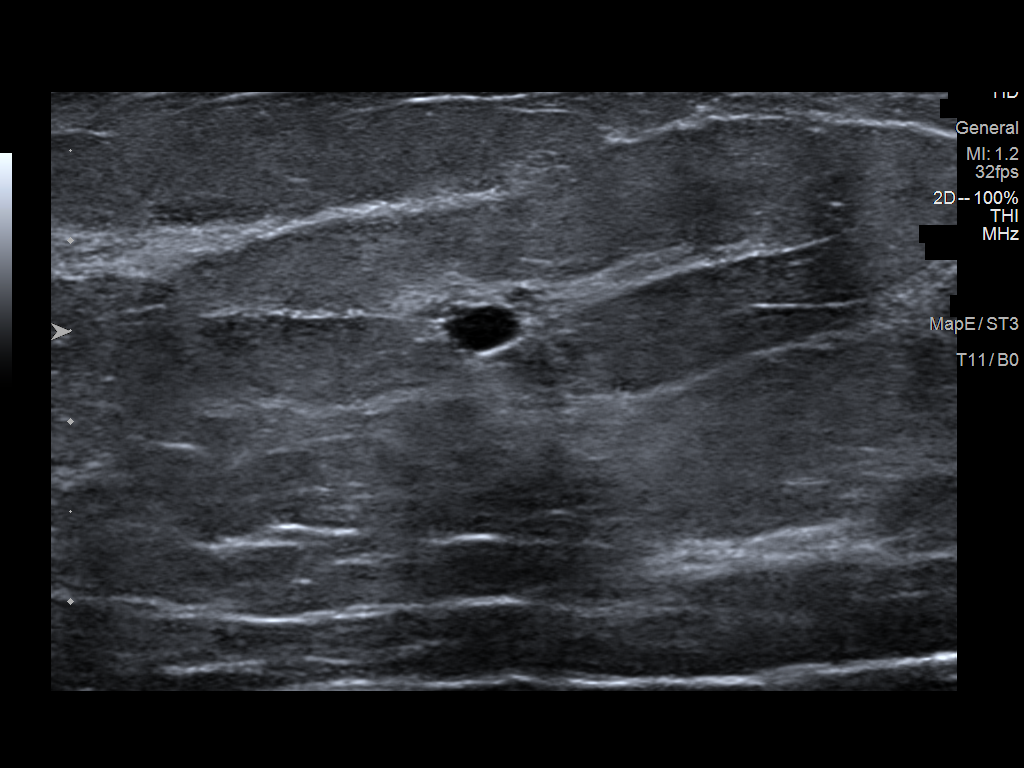
[im 2/5]
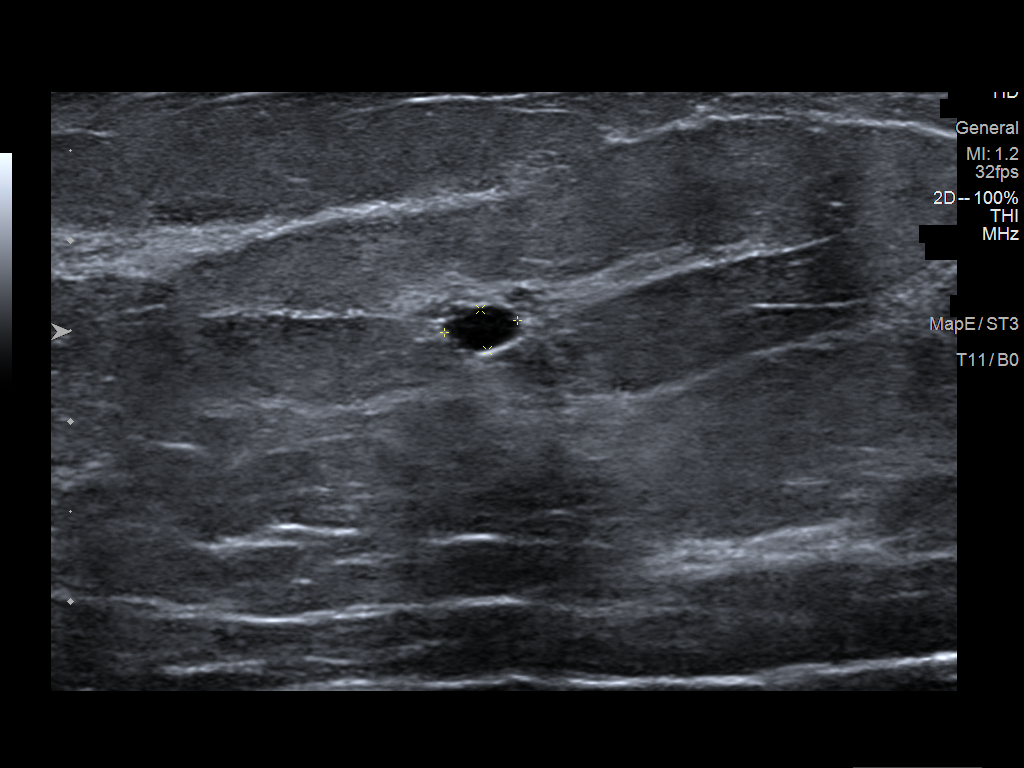
[im 3/5]
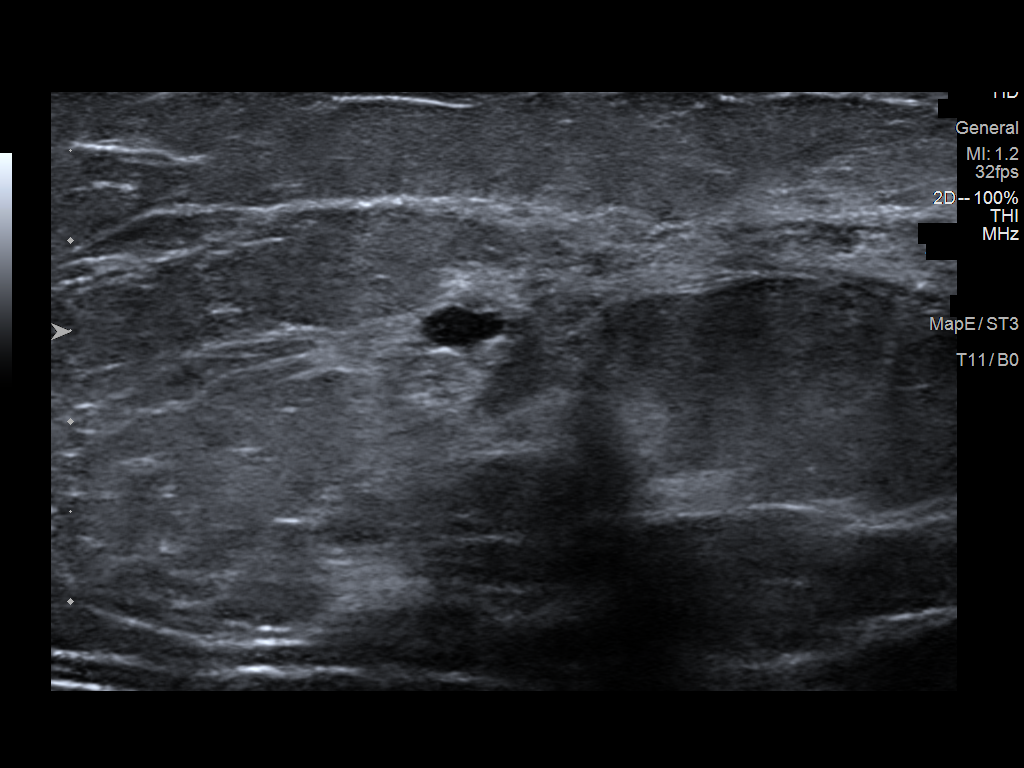
[im 4/5]
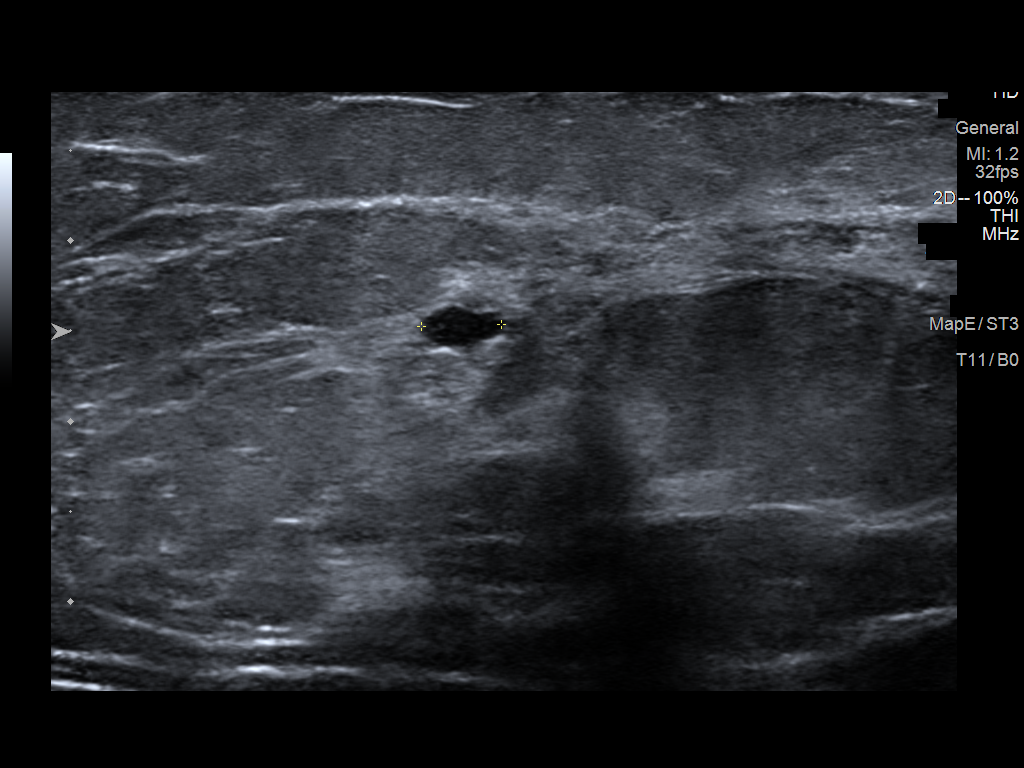
[im 5/5]
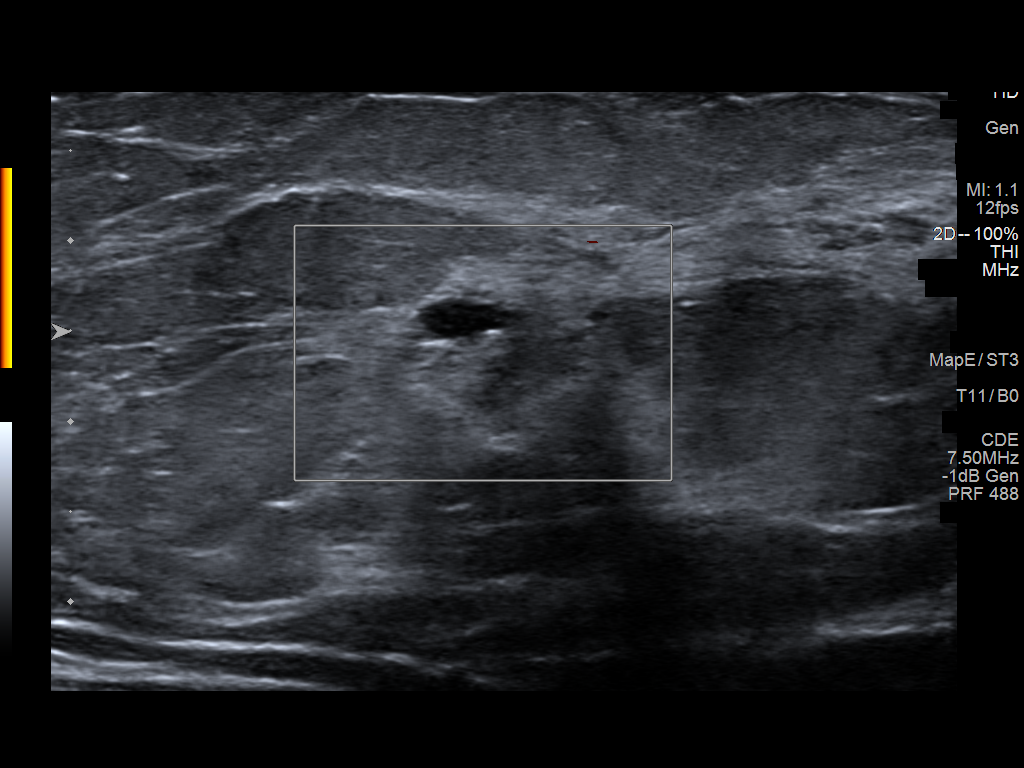

[5 of 5 positions shown; findings below may reference images not displayed]

ACR Breast Density Category b: There are scattered areas of
fibroglandular density.
FINDINGS: In the central to slightly lateral right breast, posterior depth
there is a 4 mm circumscribed oval mass. No other suspicious
calcifications, masses or areas of distortion are seen in the
bilateral breasts.

Mammographic images were processed with CAD.

Targeted ultrasound is performed, showing a circumscribed anechoic
mass at 9989, 3 cm from the nipple measuring 4 x 2 x 4 mm
IMPRESSION: The mass of concern in the right breast corresponds with a benign
cyst. No mammographic or targeted sonographic evidence of right
breast malignancy.

RECOMMENDATION:
Screening mammogram in one year.(Code:[id number])

I have discussed the findings and recommendations with the patient.
Results were also provided in writing at the conclusion of the
visit. If applicable, a reminder letter will be sent to the patient
regarding the next appointment.

BI-RADS CATEGORY  2: Benign.

## 2017-09-24 ENCOUNTER — Encounter: Payer: Self-pay | Admitting: Internal Medicine

## 2017-12-09 ENCOUNTER — Other Ambulatory Visit: Payer: Self-pay

## 2017-12-18 ENCOUNTER — Other Ambulatory Visit: Payer: Self-pay

## 2017-12-26 ENCOUNTER — Other Ambulatory Visit: Payer: BLUE CROSS/BLUE SHIELD

## 2017-12-26 DIAGNOSIS — Z1389 Encounter for screening for other disorder: Secondary | ICD-10-CM

## 2017-12-26 DIAGNOSIS — R809 Proteinuria, unspecified: Secondary | ICD-10-CM | POA: Diagnosis not present

## 2017-12-26 LAB — URINALYSIS, COMPLETE
Bacteria, UA: NONE SEEN /HPF
Bilirubin Urine: NEGATIVE
Glucose, UA: NEGATIVE
Hyaline Cast: NONE SEEN /LPF
Ketones, ur: NEGATIVE
Leukocytes, UA: NEGATIVE
Nitrite: NEGATIVE
Protein, ur: NEGATIVE
RBC / HPF: NONE SEEN /HPF (ref 0–2)
Specific Gravity, Urine: 1.011 (ref 1.001–1.03)
Squamous Epithelial / LPF: NONE SEEN /HPF (ref <=5)
WBC, UA: NONE SEEN /HPF (ref 0–5)
pH: 7 (ref 5.0–8.0)

## 2017-12-31 ENCOUNTER — Encounter: Payer: Self-pay | Admitting: Adult Health

## 2017-12-31 ENCOUNTER — Other Ambulatory Visit: Payer: Self-pay | Admitting: Adult Health

## 2017-12-31 DIAGNOSIS — R823 Hemoglobinuria: Secondary | ICD-10-CM

## 2018-05-22 ENCOUNTER — Other Ambulatory Visit: Payer: Self-pay | Admitting: Nephrology

## 2018-05-22 DIAGNOSIS — E559 Vitamin D deficiency, unspecified: Secondary | ICD-10-CM

## 2018-05-22 DIAGNOSIS — R319 Hematuria, unspecified: Secondary | ICD-10-CM

## 2018-05-22 DIAGNOSIS — Z8632 Personal history of gestational diabetes: Secondary | ICD-10-CM

## 2018-05-23 ENCOUNTER — Ambulatory Visit
Admission: RE | Admit: 2018-05-23 | Discharge: 2018-05-23 | Disposition: A | Discharge status: Home / Self Care | Payer: BLUE CROSS/BLUE SHIELD | Source: Ambulatory Visit | Attending: Nephrology | Admitting: Nephrology

## 2018-05-23 DIAGNOSIS — Z8632 Personal history of gestational diabetes: Secondary | ICD-10-CM

## 2018-05-23 DIAGNOSIS — R319 Hematuria, unspecified: Secondary | ICD-10-CM

## 2018-05-23 DIAGNOSIS — E559 Vitamin D deficiency, unspecified: Secondary | ICD-10-CM

## 2018-08-27 DIAGNOSIS — Z9109 Other allergy status, other than to drugs and biological substances: Secondary | ICD-10-CM | POA: Insufficient documentation

## 2018-08-27 DIAGNOSIS — E785 Hyperlipidemia, unspecified: Secondary | ICD-10-CM | POA: Insufficient documentation

## 2018-08-27 DIAGNOSIS — Z6825 Body mass index (BMI) 25.0-25.9, adult: Secondary | ICD-10-CM | POA: Insufficient documentation

## 2018-08-27 NOTE — Progress Notes (Signed)
Complete Physical  Assessment and Plan:  Carole was seen today for annual exam.  Diagnoses and all orders for this visit:  Encounter for routine adult health examination with abnormal findings  Insulin resistance, Hx -     Continue healthy diet/exercise, weight maintenance -     Hemoglobin A1c  Vitamin D deficiency -     VITAMIN D 25 Hydroxy (Vit-D Deficiency, Fractures)  Medication management -     CBC with Differential/Platelet -     CMP/GFR -     Magnesium  Screening for cardiovascular condition -     EKG 12-Lead  Screening for colon cancer -     Referral to GI for colonoscopy placed  Screening for hematuria or proteinuria -     Urinalysis, Complete (81001)  Hyperlipidemia  Mild, not currently on treatment Continue low cholesterol diet and exercise.  Check lipid panel.  -     TSH -     Lipid panel  Discussed med's effects and SE's. Screening labs and tests as requested with regular follow-up as recommended. Over 40 minutes of exam, counseling, chart review, and complex, high level critical decision making was performed this visit.   Future Appointments  Date Time Provider [place]  09/02/2019  3:00 PM Liane Comber, NP [facility] None    HPI  52 y.o. very healthy married female of two grown boys with new twin granddaughters, property accountant, presents for a complete physical and follow up for has Insulin resistance, Hx; Vitamin D deficiency; Medication management; Environmental allergies; BMI 25.0-25.9,adult; and Hyperlipidemia on their problem list.    She is following up from L knee ACL recontruction with menisectomy last year, did PT and was release in Jan 2019 and doing well other than mild intermittent soreness. Denies other concerns, states she is doing very well. She is followed annually by GYN and performs monthly breast checks.   She was referred to urology for concerns of ongoing hematuria, saw Dr. Junious Silk with cysto which was negative,  will follow up annually.   BMI is Body mass index is 23.24 kg/m., she has been working on diet and exercise, started weight watchers after weight getting up to 139 lb, goal is 117 lb.  Wt Readings from Last 3 Encounters:  08/28/18 121 lb (54.9 kg)  08/27/17 134 lb 9.6 oz (61.1 kg)  08/06/16 133 lb 6.4 oz (60.5 kg)   Her blood pressure today is BP: 114/78 She does workout. She denies chest pain, shortness of breath, dizziness.   She is not on cholesterol medication and denies myalgias. Her cholesterol is not at goal. The cholesterol last visit was:   Lab Results  Component Value Date   CHOL 217 (H) 08/27/2017   HDL 92 08/27/2017   LDLCALC 106 (H) 08/27/2017   TRIG 99 08/27/2017   CHOLHDL 2.4 08/27/2017   She has been working on diet and exercise for history of insulin resistance. Last A1C in the office was:  Lab Results  Component Value Date   HGBA1C 4.8 08/27/2017   Last GFR: Lab Results  Component Value Date   GFRNONAA 103 08/27/2017   Patient is on Vitamin D supplement but below goal- she has since increased the dose:   Lab Results  Component Value Date   VD25OH 53 08/27/2017      Current Medications:  Current Outpatient Medications on File Prior to Visit  Medication Sig Dispense Refill  . cetirizine (ZYRTEC) 10 MG tablet Take 10 mg by mouth daily. OTC    .  Cholecalciferol (VITAMIN D PO) Take 2,000 Int'l Units by mouth daily.     . TURMERIC PO Take 1 tablet by mouth daily.     No current facility-administered medications on file prior to visit.    Allergies:  No Known Allergies Medical History:  She has Insulin resistance, Hx; Vitamin D deficiency; Medication management; Environmental allergies; BMI 25.0-25.9,adult; and Hyperlipidemia on their problem list. Health Maintenance:   Immunization History  Administered Date(s) Administered  . Influenza-Unspecified 08/01/2016, 08/07/2017, 08/06/2018  . PPD Test 07/05/2014, 07/07/2015, 08/06/2016  . Pneumococcal  Polysaccharide-23 06/01/2010  . Td 10/22/2004  . Tdap 07/07/2015    Tetanus: 2016 Flu vaccine: 2019 at work Shingrix: n/a  LMP: No LMP recorded. Pap: 05/2018 - by GYN Dr. Ouida Sills MGM: 2019 by GYN DEXA: n/a Colonoscopy: DUE EGD: n/a  Last Dental Exam: Dr. Lavonne Chick, 10/2017 q 6 months Last Eye Exam: 06/2018, wears contacts, no issues, goes annually Last Derm Exam: Dr. Chana Bode, goes q12 months, no concerning lesions  Patient Care Team: Unk Pinto, MD as PCP - General (Internal Medicine) Deirdre Pippins, PA-C as Physician Assistant (Internal Medicine)  Surgical History:  She has a past surgical history that includes Anterior cruciate ligament repair (Left, 02/2017) and Wisdom tooth extraction. Family History:  Herfamily history includes Breast cancer (age of onset: 58) in her maternal aunt; Breast cancer (age of onset: 89) in her maternal grandmother; Diabetes type II in her maternal grandfather; Heart disease in her maternal grandfather and maternal grandmother; Hypertension in her mother. Social History:  She reports that she has never smoked. She has never used smokeless tobacco. She reports that she does not drink alcohol or use drugs.  Review of Systems: Review of Systems  Constitutional: Negative for malaise/fatigue and weight loss.  HENT: Negative for hearing loss and tinnitus.   Eyes: Negative for blurred vision and double vision.  Respiratory: Negative for cough, shortness of breath and wheezing.   Cardiovascular: Negative for chest pain, palpitations, orthopnea, claudication and leg swelling.  Gastrointestinal: Negative for abdominal pain, blood in stool, constipation, diarrhea, heartburn, melena, nausea and vomiting.  Genitourinary: Negative.   Musculoskeletal: Negative for joint pain and myalgias.  Skin: Negative for rash.  Neurological: Negative for dizziness, tingling, sensory change, weakness and headaches.  Endo/Heme/Allergies: Negative for  polydipsia.  Psychiatric/Behavioral: Negative.   All other systems reviewed and are negative.   Physical Exam: Estimated body mass index is 23.24 kg/m as calculated from the following:   Height as of this encounter: 5' 0.5" (1.537 m).   Weight as of this encounter: 121 lb (54.9 kg). BP 114/78   Pulse 75   Temp (!) 97.5 F (36.4 C)   Ht 5' 0.5" (1.537 m)   Wt 121 lb (54.9 kg)   SpO2 93%   BMI 23.24 kg/m  General Appearance: Well nourished, in no apparent distress.  Eyes: PERRLA, EOMs, conjunctiva no swelling or erythema, normal fundi and vessels.  Sinuses: No Frontal/maxillary tenderness  ENT/Mouth: Ext aud canals clear, normal light reflex with TMs without erythema, bulging. Good dentition. No erythema, swelling, or exudate on post pharynx. Tonsils not swollen or erythematous. Hearing normal.  Neck: Supple, thyroid normal. No bruits  Respiratory: Respiratory effort normal, BS equal bilaterally without rales, rhonchi, wheezing or stridor.  Cardio: RRR without murmurs, rubs or gallops. Brisk peripheral pulses without edema.  Chest: symmetric, with normal excursions and percussion.  Breasts: defer to GYN Abdomen: Soft, nontender, no guarding, rebound, hernias, masses, or organomegaly.  Lymphatics: Non tender without  lymphadenopathy.  Genitourinary: defer to GYN Musculoskeletal: Full ROM all peripheral extremities,5/5 strength, and normal gait.  Skin: Warm, dry without rashes, lesions, ecchymosis. Neuro: Cranial nerves intact, reflexes equal bilaterally. Normal muscle tone, no cerebellar symptoms. Sensation intact.  Psych: Awake and oriented X 3, normal affect, Insight and Judgment appropriate.   EKG: WNL no ST changes.  Gorden Harms Abria Vannostrand 3:20 PM [facility]

## 2018-08-28 ENCOUNTER — Ambulatory Visit: Payer: BLUE CROSS/BLUE SHIELD | Admitting: Adult Health

## 2018-08-28 ENCOUNTER — Encounter: Payer: Self-pay | Admitting: Adult Health

## 2018-08-28 VITALS — BP 114/78 | HR 75 | Temp 97.5°F | Ht 60.5 in | Wt 121.0 lb

## 2018-08-28 DIAGNOSIS — Z79899 Other long term (current) drug therapy: Secondary | ICD-10-CM | POA: Diagnosis not present

## 2018-08-28 DIAGNOSIS — I10 Essential (primary) hypertension: Secondary | ICD-10-CM

## 2018-08-28 DIAGNOSIS — Z136 Encounter for screening for cardiovascular disorders: Secondary | ICD-10-CM | POA: Diagnosis not present

## 2018-08-28 DIAGNOSIS — Z9109 Other allergy status, other than to drugs and biological substances: Secondary | ICD-10-CM

## 2018-08-28 DIAGNOSIS — Z13220 Encounter for screening for lipoid disorders: Secondary | ICD-10-CM

## 2018-08-28 DIAGNOSIS — Z0000 Encounter for general adult medical examination without abnormal findings: Secondary | ICD-10-CM | POA: Diagnosis not present

## 2018-08-28 DIAGNOSIS — Z1211 Encounter for screening for malignant neoplasm of colon: Secondary | ICD-10-CM

## 2018-08-28 DIAGNOSIS — E782 Mixed hyperlipidemia: Secondary | ICD-10-CM

## 2018-08-28 DIAGNOSIS — E8881 Metabolic syndrome: Secondary | ICD-10-CM

## 2018-08-28 DIAGNOSIS — Z1389 Encounter for screening for other disorder: Secondary | ICD-10-CM | POA: Diagnosis not present

## 2018-08-28 DIAGNOSIS — E559 Vitamin D deficiency, unspecified: Secondary | ICD-10-CM

## 2018-08-28 DIAGNOSIS — Z131 Encounter for screening for diabetes mellitus: Secondary | ICD-10-CM | POA: Diagnosis not present

## 2018-08-28 DIAGNOSIS — Z6823 Body mass index (BMI) 23.0-23.9, adult: Secondary | ICD-10-CM

## 2018-08-28 DIAGNOSIS — Z1329 Encounter for screening for other suspected endocrine disorder: Secondary | ICD-10-CM

## 2018-08-28 NOTE — Patient Instructions (Addendum)
Check with insurance about shingrix vaccine     Tricia Berger , Thank you for taking time to come for your Annual Wellness Visit. I appreciate your ongoing commitment to your health goals. Please review the following plan we discussed and let me know if I can assist you in the future.   These are the goals we discussed: Goals    . Exercise 150 min/wk Moderate Activity     Aim for 2-3 times of weights/ strength training       This is a list of the screening recommended for you and due dates:  Health Maintenance  Topic Date Due  . Colon Cancer Screening  11/03/2017  . HIV Screening  08/29/2019*  . Mammogram  10/23/2019  . Pap Smear  10/22/2020  . Tetanus Vaccine  07/06/2025  . Flu Shot  Completed  *Topic was postponed. The date shown is not the original due date.    Know what a healthy weight is for you (roughly BMI <25) and aim to maintain this  Aim for 7+ servings of fruits and vegetables daily  65-80+ fluid ounces of water or unsweet tea for healthy kidneys  Limit to max 1 drink of alcohol per day; avoid smoking/tobacco  Limit animal fats in diet for cholesterol and heart health - choose grass fed whenever available  Avoid highly processed foods, and foods high in saturated/trans fats  Aim for low stress - take time to unwind and care for your mental health  Aim for 150 min of moderate intensity exercise weekly for heart health, and weights twice weekly for bone health  Aim for 7-9 hours of sleep daily     Preventing High Cholesterol Cholesterol is a waxy, fat-like substance that your body needs in small amounts. Your liver makes all the cholesterol that your body needs. Having high cholesterol (hypercholesterolemia) increases your risk for heart disease and stroke. Extra (excess) cholesterol comes from the food you eat, such as animal-based fat (saturated fat) from meat and some dairy products. High cholesterol can often be prevented with diet and lifestyle changes.  If you already have high cholesterol, you can control it with diet and lifestyle changes, as well as medicine. What nutrition changes can be made?  Eat less saturated fat. Foods that contain saturated fat include red meat and some dairy products.  Avoid processed meats, like bacon and lunch meats.  Avoid trans fats, which are found in margarine and some baked goods.  Avoid foods and beverages that have added sugars.  Eat more fruits, vegetables, and whole grains.  Choose healthy sources of protein, such as fish, poultry, and nuts.  Choose healthy sources of fat, such as: ? Nuts. ? Vegetable oils, especially olive oil. ? Fish that have healthy fats (omega-3 fatty acids), such as mackerel or salmon. What lifestyle changes can be made?  Lose weight if you are overweight. Losing 5-10 lb (2.3-4.5 kg) can help prevent or control high cholesterol and reduce your risk for diabetes and high blood pressure. Ask your health care provider to help you with a diet and exercise plan to safely lose weight.  Get enough exercise. Do at least 150 minutes of moderate-intensity exercise each week. ? You could do this in short exercise sessions several times a day, or you could do longer exercise sessions a few times a week. For example, you could take a brisk 10-minute walk or bike ride, 3 times a day, for 5 days a week.  Do not smoke. If you  need help quitting, ask your health care provider.  Limit your alcohol intake. If you drink alcohol, limit alcohol intake to no more than 1 drink a day for nonpregnant women and 2 drinks a day for men. One drink equals 12 oz of beer, 5 oz of wine, or 1 oz of hard liquor. Why are these changes important? If you have high cholesterol, deposits (plaques) may build up on the walls of your blood vessels. Plaques make the arteries narrower and stiffer, which can restrict or block blood flow and cause blood clots to form. This greatly increases your risk for heart attack and  stroke. Making diet and lifestyle changes can reduce your risk for these life-threatening conditions. What can I do to lower my risk?  Manage your risk factors for high cholesterol. Talk with your health care provider about all of your risk factors and how to lower your risk.  Manage other conditions that you have, such as diabetes or high blood pressure (hypertension).  Have your cholesterol checked at regular intervals.  Keep all follow-up visits as told by your health care provider. This is important. How is this treated? In addition to diet and lifestyle changes, your health care provider may recommend medicines to help lower cholesterol, such as a medicine to reduce the amount of cholesterol made in your liver. You may need medicine if:  Diet and lifestyle changes do not lower your cholesterol enough.  You have high cholesterol and other risk factors for heart disease or stroke.  Take over-the-counter and prescription medicines only as told by your health care provider. Where to find more information:  American Heart Association: ThisTune.com.pt.jsp  National Heart, Lung, and Blood Institute: FrenchToiletries.com.cy Summary  High cholesterol increases your risk for heart disease and stroke. By keeping your cholesterol level low, you can reduce your risk for these conditions.  Diet and lifestyle changes are the most important steps in preventing high cholesterol.  Work with your health care provider to manage your risk factors, and have your blood tested regularly. This information is not intended to replace advice given to you by your health care provider. Make sure you discuss any questions you have with your health care provider. Document Released: 10/23/2015 Document Revised: 06/16/2016 Document Reviewed: 06/16/2016 Elsevier Interactive Patient Education  [employer].

## 2018-08-29 LAB — COMPLETE METABOLIC PANEL WITH GFR
AG Ratio: 1.7 (calc) (ref 1.0–2.5)
ALT: 15 U/L (ref 6–29)
AST: 19 U/L (ref 10–35)
Albumin: 4.7 g/dL (ref 3.6–5.1)
Alkaline phosphatase (APISO): 61 U/L (ref 33–130)
BUN: 18 mg/dL (ref 7–25)
CO2: 28 mmol/L (ref 20–32)
Calcium: 10.1 mg/dL (ref 8.6–10.4)
Chloride: 101 mmol/L (ref 98–110)
Creat: 0.81 mg/dL (ref 0.50–1.05)
GFR, Est African American: 98 mL/min/{1.73_m2} (ref >=60)
GFR, Est Non African American: 85 mL/min/{1.73_m2} (ref >=60)
Globulin: 2.7 g/dL (calc) (ref 1.9–3.7)
Glucose, Bld: 83 mg/dL (ref 65–99)
Potassium: 4.7 mmol/L (ref 3.5–5.3)
Sodium: 140 mmol/L (ref 135–146)
Total Bilirubin: 0.4 mg/dL (ref 0.2–1.2)
Total Protein: 7.4 g/dL (ref 6.1–8.1)

## 2018-08-29 LAB — CBC WITH DIFFERENTIAL/PLATELET
Basophils Absolute: 64 cells/uL (ref 0–200)
Basophils Relative: 0.7 %
Eosinophils Absolute: 239 cells/uL (ref 15–500)
Eosinophils Relative: 2.6 %
HCT: 40.2 % (ref 35.0–45.0)
Hemoglobin: 13.5 g/dL (ref 11.7–15.5)
Lymphs Abs: 3229 cells/uL (ref 850–3900)
MCH: 30.7 pg (ref 27.0–33.0)
MCHC: 33.6 g/dL (ref 32.0–36.0)
MCV: 91.4 fL (ref 80.0–100.0)
MPV: 10.8 fL (ref 7.5–12.5)
Monocytes Relative: 6.7 %
Neutro Abs: 5051 cells/uL (ref 1500–7800)
Neutrophils Relative %: 54.9 %
Platelets: 326 10*3/uL (ref 140–400)
RBC: 4.4 10*6/uL (ref 3.80–5.10)
RDW: 12.5 % (ref 11.0–15.0)
Total Lymphocyte: 35.1 %
WBC mixed population: 616 cells/uL (ref 200–950)
WBC: 9.2 10*3/uL (ref 3.8–10.8)

## 2018-08-29 LAB — URINALYSIS W MICROSCOPIC + REFLEX CULTURE
Bacteria, UA: NONE SEEN /HPF
Bilirubin Urine: NEGATIVE
Glucose, UA: NEGATIVE
Hgb urine dipstick: NEGATIVE
Hyaline Cast: NONE SEEN /LPF
Ketones, ur: NEGATIVE
Leukocyte Esterase: NEGATIVE
Nitrites, Initial: NEGATIVE
Protein, ur: NEGATIVE
RBC / HPF: NONE SEEN /HPF (ref 0–2)
Specific Gravity, Urine: 1.007 (ref 1.001–1.03)
Squamous Epithelial / LPF: NONE SEEN /HPF (ref <=5)
WBC, UA: NONE SEEN /HPF (ref 0–5)
pH: 6 (ref 5.0–8.0)

## 2018-08-29 LAB — NO CULTURE INDICATED

## 2018-08-29 LAB — LIPID PANEL
Cholesterol: 221 mg/dL — ABNORMAL HIGH (ref <200)
HDL: 92 mg/dL (ref >50)
LDL Cholesterol (Calc): 112 mg/dL (calc) — ABNORMAL HIGH
Non-HDL Cholesterol (Calc): 129 mg/dL (calc) (ref <130)
Total CHOL/HDL Ratio: 2.4 (calc) (ref <5.0)
Triglycerides: 83 mg/dL (ref <150)

## 2018-08-29 LAB — HEMOGLOBIN A1C
Hgb A1c MFr Bld: 5 % of total Hgb (ref <5.7)
Mean Plasma Glucose: 97 (calc)
eAG (mmol/L): 5.4 (calc)

## 2018-08-29 LAB — VITAMIN D 25 HYDROXY (VIT D DEFICIENCY, FRACTURES): Vit D, 25-Hydroxy: 60 ng/mL (ref 30–100)

## 2018-08-29 LAB — MICROALBUMIN / CREATININE URINE RATIO
Creatinine, Urine: 21 mg/dL (ref 20–275)
Microalb, Ur: <0.2 mg/dL

## 2018-08-29 LAB — TSH: TSH: 2.05 mIU/L

## 2018-08-29 LAB — MAGNESIUM: Magnesium: 2.1 mg/dL (ref 1.5–2.5)

## 2018-12-03 ENCOUNTER — Encounter: Payer: Self-pay | Admitting: Internal Medicine

## 2018-12-29 ENCOUNTER — Encounter: Payer: Self-pay | Admitting: Adult Health

## 2018-12-29 ENCOUNTER — Ambulatory Visit: Payer: BLUE CROSS/BLUE SHIELD | Admitting: Adult Health

## 2018-12-29 VITALS — BP 120/74 | HR 100 | Temp 97.5°F | Ht 60.5 in | Wt 125.0 lb

## 2018-12-29 DIAGNOSIS — R21 Rash and other nonspecific skin eruption: Secondary | ICD-10-CM

## 2018-12-29 MED ORDER — VALACYCLOVIR HCL 1 G PO TABS: 1000.0000 mg | ORAL_TABLET | Freq: Three times a day (TID) | ORAL | 0 refills | Status: DC

## 2018-12-29 MED ORDER — PREDNISONE 20 MG PO TABS: ORAL_TABLET | ORAL | 0 refills | Status: DC

## 2018-12-29 NOTE — Patient Instructions (Signed)
Shingles  Shingles, which is also known as herpes zoster, is an infection that causes a painful skin rash and fluid-filled blisters. It is caused by a virus. Shingles only develops in people who:  Have had chickenpox.  Have been given a medicine to protect against chickenpox (have been vaccinated). Shingles is rare in this group. What are the causes? Shingles is caused by varicella-zoster virus (VZV). This is the same virus that causes chickenpox. After a person is exposed to VZV, the virus stays in the body in an inactive (dormant) state. Shingles develops if the virus is reactivated. This can happen many years after the first (initial) exposure to VZV. It is not known what causes this virus to be reactivated. What increases the risk? People who have had chickenpox or received the chickenpox vaccine are at risk for shingles. Shingles infection is more common in people who:  Are older than age 51.  Have a weakened disease-fighting system (immune system), such as people with: ? HIV. ? AIDS. ? Cancer.  Are taking medicines that weaken the immune system, such as transplant medicines.  Are experiencing a lot of stress. What are the signs or symptoms? Early symptoms of this condition include itching, tingling, and pain in an area on your skin. Pain may be described as burning, stabbing, or throbbing. A few days or weeks after early symptoms start, a painful red rash appears. The rash is usually on one side of the body and has a band-like or belt-like pattern. The rash eventually turns into fluid-filled blisters that break open, change into scabs, and dry up in about 2-3 weeks. At any time during the infection, you may also develop:  A fever.  Chills.  A headache.  An upset stomach. How is this diagnosed? This condition is diagnosed with a skin exam. Skin or fluid samples may be taken from the blisters before a diagnosis is made. These samples are examined under a microscope or sent to  a lab for testing. How is this treated? The rash may last for several weeks. There is not a specific cure for this condition. Your health care provider will probably prescribe medicines to help you manage pain, recover more quickly, and avoid long-term problems. Medicines may include:  Antiviral drugs.  Anti-inflammatory drugs.  Pain medicines.  Anti-itching medicines (antihistamines). If the area involved is on your face, you may be referred to a specialist, such as an eye doctor (ophthalmologist) or an ear, nose, and throat (ENT) doctor (otolaryngologist) to help you avoid eye problems, chronic pain, or disability. Follow these instructions at home: Medicines  Take over-the-counter and prescription medicines only as told by your health care provider.  Apply an anti-itch cream or numbing cream to the affected area as told by your health care provider. Relieving itching and discomfort   Apply cold, wet cloths (cold compresses) to the area of the rash or blisters as told by your health care provider.  Cool baths can be soothing. Try adding baking soda or dry oatmeal to the water to reduce itching. Do not bathe in hot water. Blister and rash care  Keep your rash covered with a loose bandage (dressing). Wear loose-fitting clothing to help ease the pain of material rubbing against the rash.  Keep your rash and blisters clean by washing the area with mild soap and cool water as told by your health care provider.  Check your rash every day for signs of infection. Check for: ? More redness, swelling, or pain. ? Fluid   or blood. ? Warmth. ? Pus or a bad smell.  Do not scratch your rash or pick at your blisters. To help avoid scratching: ? Keep your fingernails clean and cut short. ? Wear gloves or mittens while you sleep, if scratching is a problem. General instructions  Rest as told by your health care provider.  Keep all follow-up visits as told by your health care provider. This  is important.  Wash your hands often with soap and water. If soap and water are not available, use hand sanitizer. Doing this lowers your chance of getting a bacterial skin infection.  Before your blisters change into scabs, your shingles infection can cause chickenpox in people who have never had it or have never been vaccinated against it. To prevent this from happening, avoid contact with other people, especially: ? Babies. ? Pregnant women. ? Children who have eczema. ? Elderly people who have transplants. ? People who have chronic illnesses, such as cancer or AIDS. Contact a health care provider if:  Your pain is not relieved with prescribed medicines.  Your pain does not get better after the rash heals.  You have signs of infection in the rash area, such as: ? More redness, swelling, or pain around the rash. ? Fluid or blood coming from the rash. ? The rash area feeling warm to the touch. ? Pus or a bad smell coming from the rash. Get help right away if:  The rash is on your face or nose.  You have facial pain, pain around your eye area, or loss of feeling on one side of your face.  You have difficulty seeing.  You have ear pain or have ringing in your ear.  You have a loss of taste.  Your condition gets worse. Summary  Shingles, which is also known as herpes zoster, is an infection that causes a painful skin rash and fluid-filled blisters.  This condition is diagnosed with a skin exam. Skin or fluid samples may be taken from the blisters and examined before the diagnosis is made.  Keep your rash covered with a loose bandage (dressing). Wear loose-fitting clothing to help ease the pain of material rubbing against the rash.  Before your blisters change into scabs, your shingles infection can cause chickenpox in people who have never had it or have never been vaccinated against it. This information is not intended to replace advice given to you by your health care  provider. Make sure you discuss any questions you have with your health care provider. Document Released: 10/08/2005 Document Revised: 06/12/2017 Document Reviewed: 06/12/2017 Elsevier Interactive Patient Education  2019 [employer]     Valacyclovir caplets What is this medicine? VALACYCLOVIR (val ay SYE kloe veer) is an antiviral medicine. It is used to treat or prevent infections caused by certain kinds of viruses. Examples of these infections include herpes and shingles. This medicine will not cure herpes. This medicine may be used for other purposes; ask your health care provider or pharmacist if you have questions. COMMON BRAND NAME(S): Valtrex What should I tell my health care provider before I take this medicine? They need to know if you have any of these conditions: -acquired immunodeficiency syndrome (AIDS) -any other condition that may weaken the immune system -bone marrow or kidney transplant -kidney disease -an unusual or allergic reaction to valacyclovir, acyclovir, ganciclovir, valganciclovir, other medicines, foods, dyes, or preservatives -pregnant or trying to get pregnant -breast-feeding How should I use this medicine? Take this medicine by mouth with  a glass of water. Follow the directions on the prescription label. You can take this medicine with or without food. Take your doses at regular intervals. Do not take your medicine more often than directed. Finish the full course prescribed by your doctor or health care professional even if you think your condition is better. Do not stop taking except on the advice of your doctor or health care professional. Talk to your pediatrician regarding the use of this medicine in children. While this drug may be prescribed for children as young as 2 years for selected conditions, precautions do apply. Overdosage: If you think you have taken too much of this medicine contact a poison control center or emergency room at once. NOTE: This  medicine is only for you. Do not share this medicine with others. What if I miss a dose? If you miss a dose, take it as soon as you can. If it is almost time for your next dose, take only that dose. Do not take double or extra doses. What may interact with this medicine? -cimetidine -probenecid This list may not describe all possible interactions. Give your health care provider a list of all the medicines, herbs, non-prescription drugs, or dietary supplements you use. Also tell them if you smoke, drink alcohol, or use illegal drugs. Some items may interact with your medicine. What should I watch for while using this medicine? Tell your doctor or health care professional if your symptoms do not start to get better after 1 week. This medicine works best when taken early in the course of an infection, within the first 38 hours. Begin treatment as soon as possible after the first signs of infection like tingling, itching, or pain in the affected area. It is possible that genital herpes may still be spread even when you are not having symptoms. Always use safer sex practices like condoms made of latex or polyurethane whenever you have sexual contact. You should stay well hydrated while taking this medicine. Drink plenty of fluids. What side effects may I notice from receiving this medicine? Side effects that you should report to your doctor or health care professional as soon as possible: -allergic reactions like skin rash, itching or hives, swelling of the face, lips, or tongue -aggressive behavior -confusion -hallucinations -problems with balance, talking, walking -stomach pain -tremor -trouble passing urine or change in the amount of urine Side effects that usually do not require medical attention (report to your doctor or health care professional if they continue or are bothersome): -dizziness -headache -nausea, vomiting This list may not describe all possible side effects. Call your doctor  for medical advice about side effects. You may report side effects to FDA at 1-800-FDA-1088. Where should I keep my medicine? Keep out of the reach of children. Store at room temperature between 15 and 25 degrees C (59 and 77 degrees F). Keep container tightly closed. Throw away any unused medicine after the expiration date. NOTE: This sheet is a summary. It may not cover all possible information. If you have questions about this medicine, talk to your doctor, pharmacist, or health care provider.  2019 Elsevier/Gold Standard (2012-09-23 16:34:05)

## 2018-12-29 NOTE — Progress Notes (Signed)
Assessment and Plan:  Tricia Berger was seen today for acute visit.  Diagnoses and all orders for this visit:  Rash and nonspecific skin eruption Non-specific on exam though cannot r/o shingles due to distribution and reports of pain; discussed risks of untreated shingles and will proceed with antiviral and oral prednisone to evaluate progress Continue to monitor for unusual insects, change bedding regularly, follow up with derm if not resolving Call back with any new symptoms or concerns -     predniSONE (DELTASONE) 20 MG tablet; 2 tablets daily for 3 days, 1 tablet daily for 4 days. -     valACYclovir (VALTREX) 1000 MG tablet; Take 1 tablet (1,000 mg total) by mouth 3 (three) times daily. Take for 7 days   Further disposition pending results of labs. Discussed med's effects and SE's.   Over 15 minutes of exam, counseling, chart review, and critical decision making was performed.   Future Appointments  Date Time Provider [employer]  09/02/2019  3:00 PM Liane Comber, NP [facility] None    ------------------------------------------------------------------------------------------------------------------   HPI BP 120/74   Pulse 100   Temp (!) 97.5 F (36.4 C)   Ht 5' 0.5" (1.537 m)   Wt 125 lb (56.7 kg)   SpO2 98%   BMI 24.01 kg/m   53 y.o.female presents for evaluation of extremely pruritic lesions to left lower back/ hip along waist line that in the past few days has noted intermittently painful in the last 2 days. She has 4-5 distinct lesions that appear with erythematous bases, scabbed over with excoriation marks and calamine lotion present.   She has tried topical bacitracin which didn't seem to improve, has been putting calamine lotion. She checked bed for bugs, had dogs groomed, changed linens, husband doesn't have any lesions. No new detergents or hygiene products. No recent travel or unusual activities outdoors. No recent new meds.   She does have appointment  scheduled with derm in 2 days, couldn't get in sooner.   Past Medical History:  Diagnosis Date  . Gestational diabetes mellitus, Hx 07/05/2014     No Known Allergies  Current Outpatient Medications on File Prior to Visit  Medication Sig  . cetirizine (ZYRTEC) 10 MG tablet Take 10 mg by mouth daily. OTC  . Cholecalciferol (VITAMIN D PO) Take 2,000 Int'l Units by mouth daily.   . TURMERIC PO Take 1 tablet by mouth daily.   No current facility-administered medications on file prior to visit.     ROS: all negative except above.   Physical Exam:  BP 120/74   Pulse 100   Temp (!) 97.5 F (36.4 C)   Ht 5' 0.5" (1.537 m)   Wt 125 lb (56.7 kg)   SpO2 98%   BMI 24.01 kg/m   General Appearance: Well nourished, in no apparent distress. Eyes: conjunctiva no swelling or erythema ENT/Mouth: No erythema, swelling, or exudate on post pharynx.  Tonsils not swollen or erythematous. Hearing normal.  Neck: Supple, thyroid normal.  Respiratory: Respiratory effort normal, BS equal bilaterally without rales, rhonchi, wheezing or stridor.  Cardio: RRR with no MRGs. Brisk peripheral pulses without edema.  Lymphatics: Non tender without lymphadenopathy.  Musculoskeletal: no obvious deformity, effusion; normal gait.  Skin: Warm, dry; she has 6 distinct lesions left lower back wrapping around with single lesion to left lower abdomen; each with erythematous base, excoriated, ? Mild serous/crusty discharge, with calamine lotion present.  Neuro: Sensation intact.  Psych: Awake and oriented X 3, normal affect, Insight and Judgment  appropriate.     Izora Ribas, NP 1:00 PM [facility]

## 2019-01-22 IMAGING — US US RENAL
1 series · 14 of 25 positions shown · non-contrast
Comparison: None.

CLINICAL DATA: Hematuria, microscopic. Vitamin-D deficiency.
History of gestational diabetes mellitus.

EXAM:
RENAL / URINARY TRACT ULTRASOUND COMPLETE

[Series 1: us renal · 0.22mm/px · 14 of 39 slices shown]
[im 1/39]
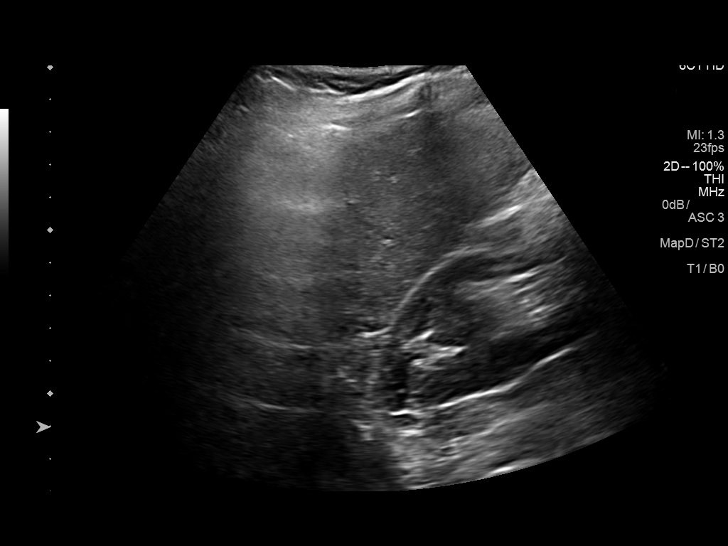
[im 4/39]
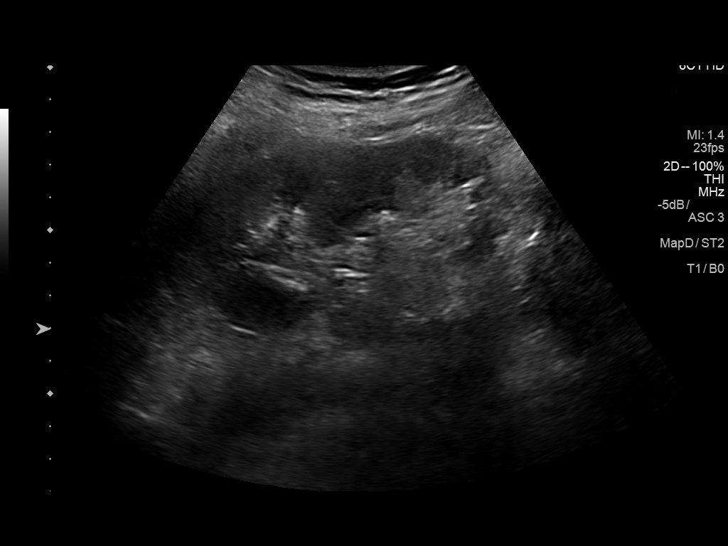
[im 7/39]
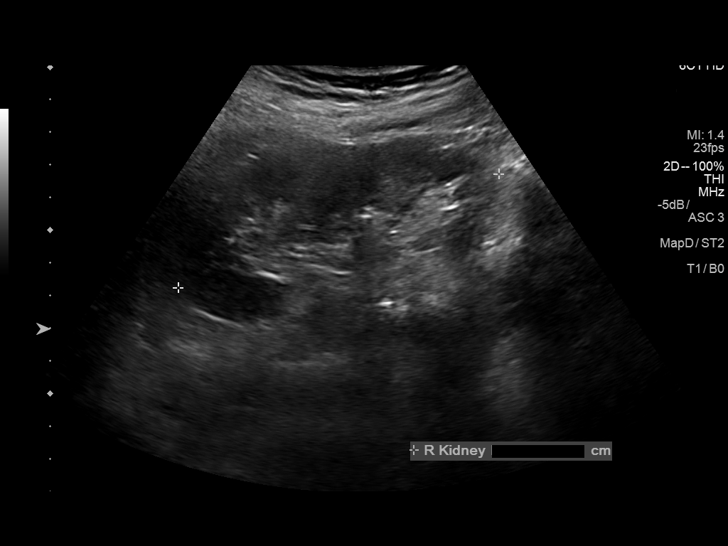
[im 10/39]
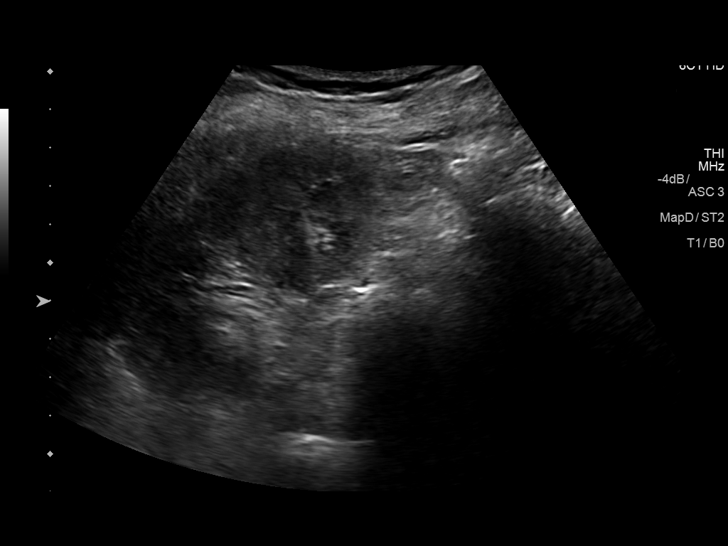
[im 13/39]
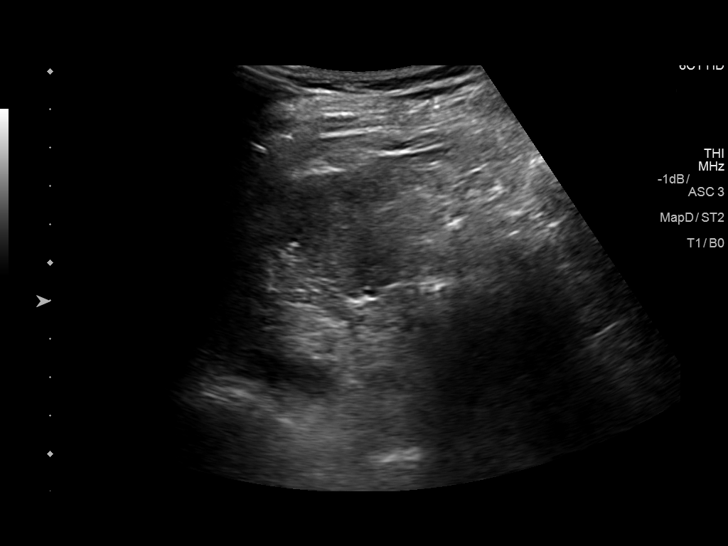
[im 15/39]
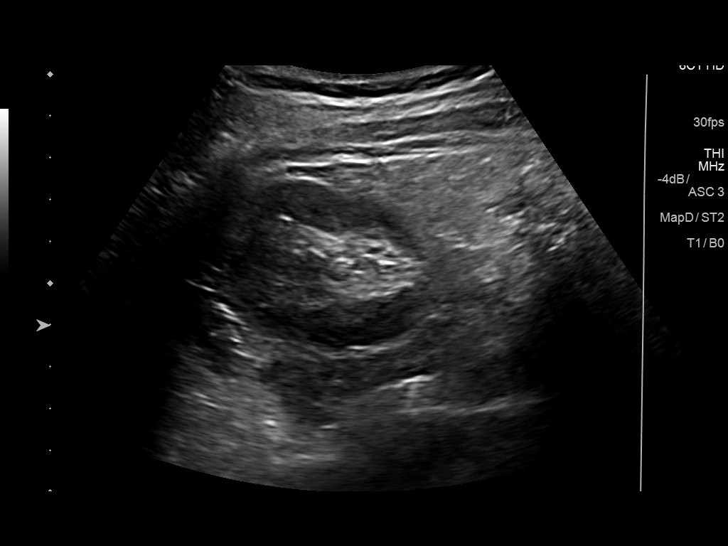
[im 18/39]
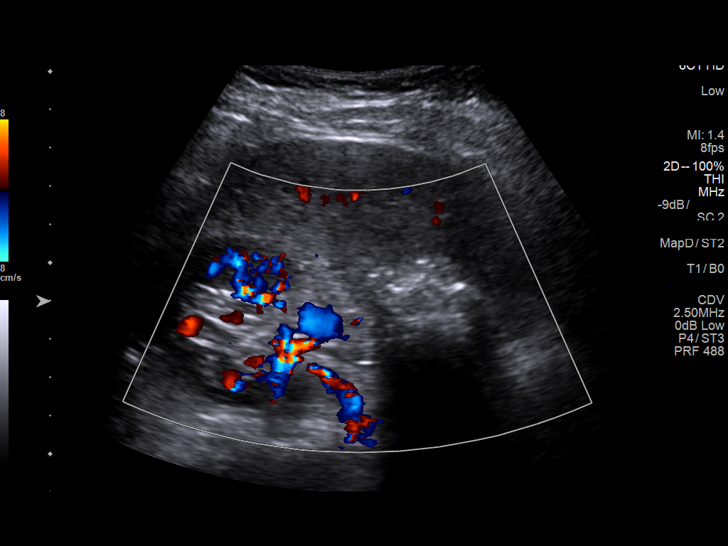
[im 21/39]
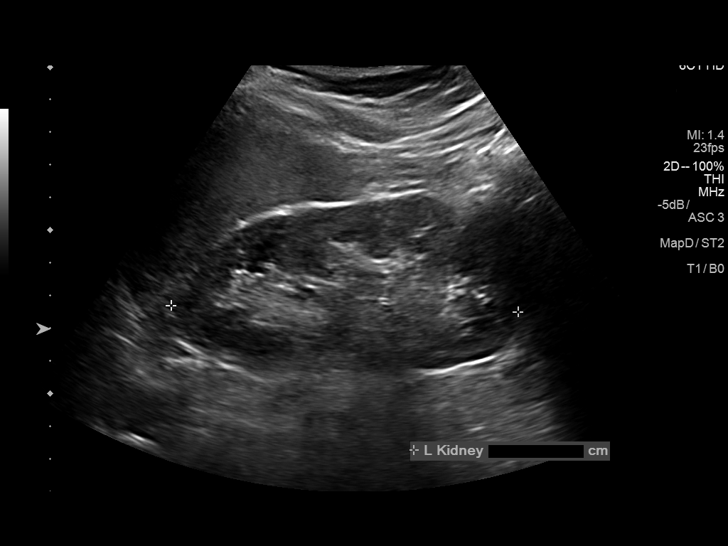
[im 24/39]
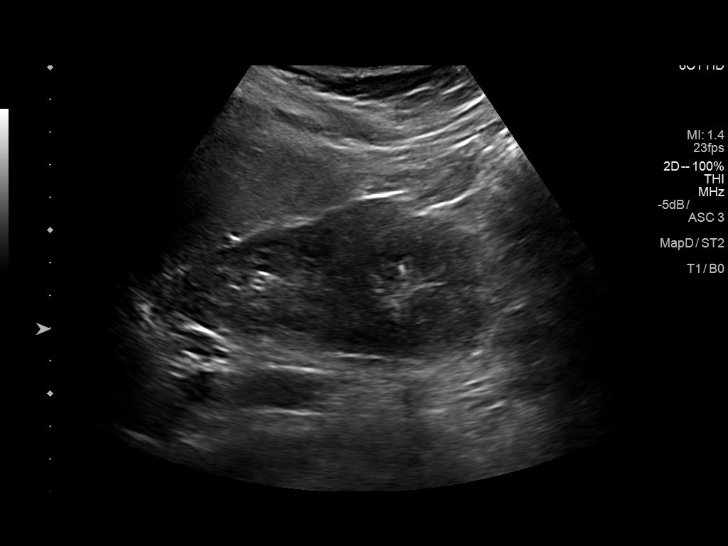
[im 26/39]
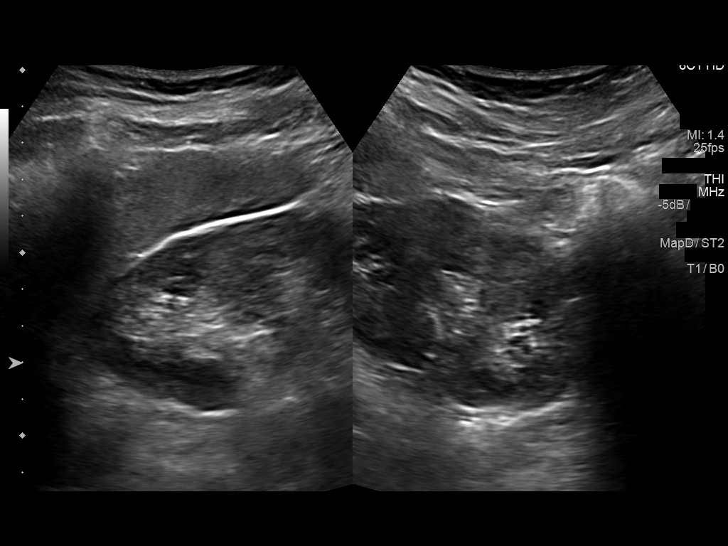
[im 29/39]
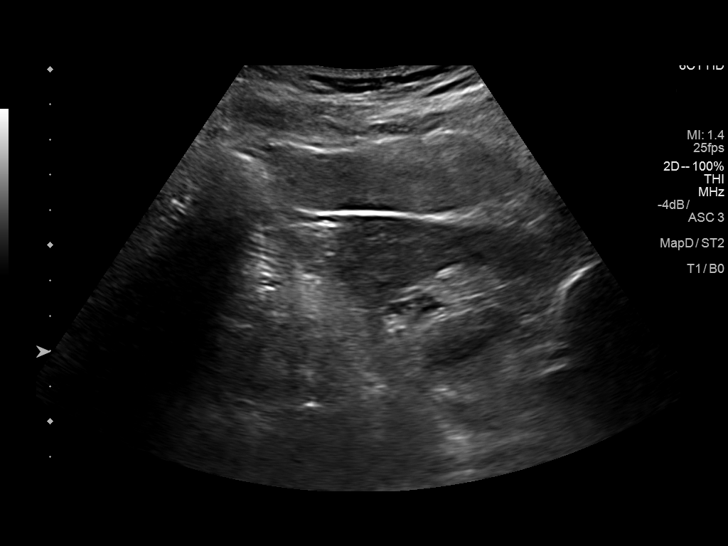
[im 32/39]
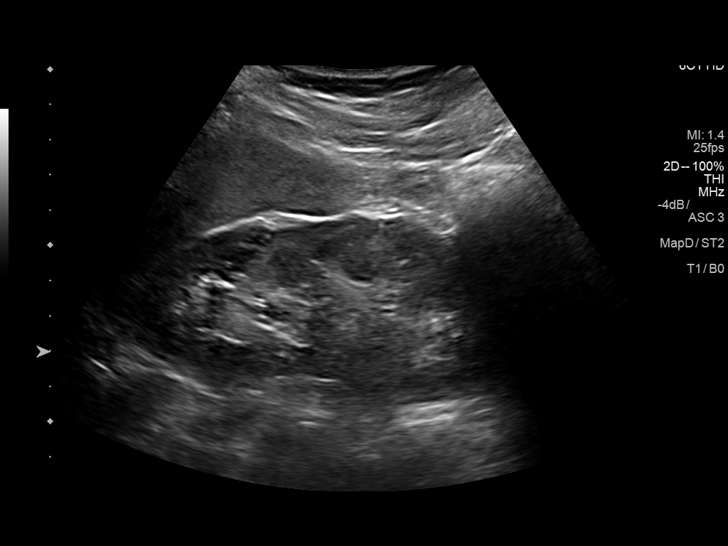
[im 35/39]
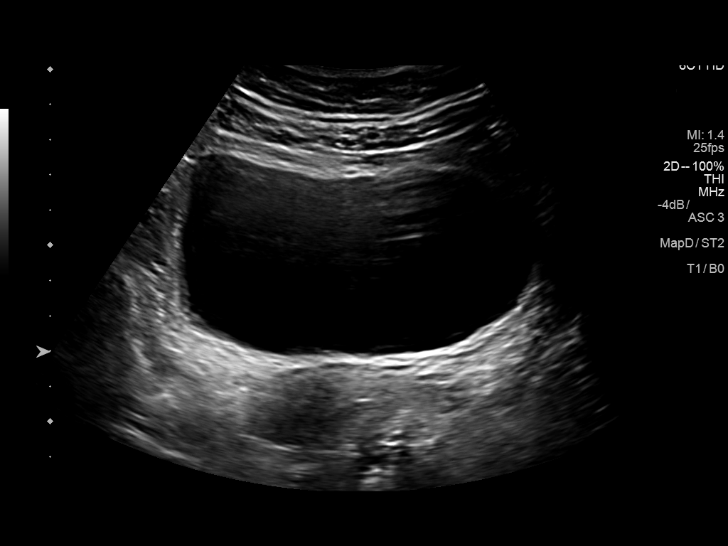
[im 39/39]
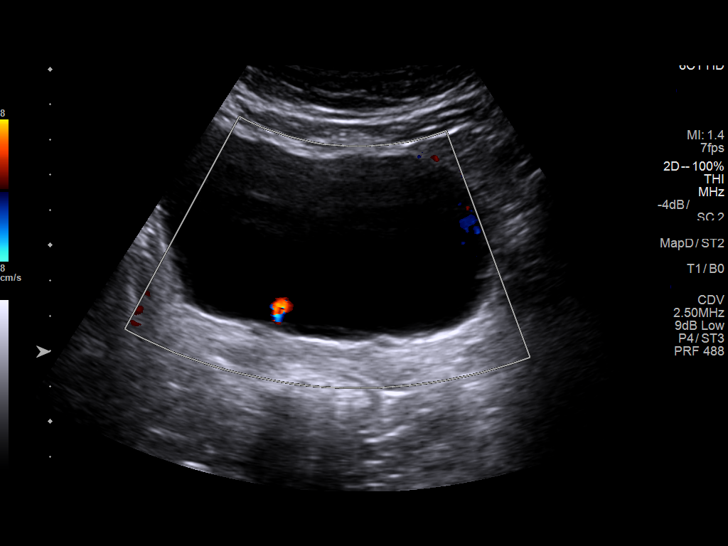

[14 of 25 positions shown; findings below may reference images not displayed]

FINDINGS: Right Kidney:

Length: 10.4 cm. Echogenicity within normal limits. No mass or
hydronephrosis visualized. No shadowing stones demonstrated.

Left Kidney:

Length: 10.6 cm. Echogenicity within normal limits. No mass or
hydronephrosis visualized. No shadowing stones demonstrated.

Bladder:

Appears normal for degree of bladder distention. Bilateral ureteral
jets are demonstrated in the bladder.
IMPRESSION: Normal ultrasound of the kidneys and bladder.

## 2019-08-31 NOTE — Progress Notes (Signed)
Complete Physical  Assessment and Plan:  Tricia Berger was seen today for annual exam.  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 Junious Silk -     Urinalysis, Complete (81001)  Hyperlipidemia  Mild, not currently on treatment Continue low cholesterol diet and exercise.  Check lipid panel.  -     TSH -     Lipid panel  Adjustment reaction, with anxious/depressed mood PHQ-9 of 7; she is interested in medication Discussed lexapro for possible benefit with mood/energy and hot flashes After discussion she would like to proceed with 3 month trial with plan to continue 9-18 months Sent in 10 mg tabs to start daily after discussion of risks, benefits and SE Follow up in 8-12 weeks to evaluate benefit Stress management techniques discussed, increase water, good sleep hygiene discussed, increase exercise, and increase veggies.  call the office if any new AE's from medications and we will switch them  Need for HIV screen No record of ever having one; will complete today with patient permission   Discussed med's effects and SE's. Screening labs and tests as requested with regular follow-up as recommended. Over 40 minutes of exam, counseling, chart review, and complex, high level critical decision making was performed this visit.   Future  Appointments  Date Time Provider Dike  12/07/2019  3:45 PM Liane Comber, NP [facility] None  09/01/2020  3:00 PM Liane Comber, NP [facility] None    HPI  53 y.o. very healthy married female, presents for a complete physical and follow up for has Insulin resistance, Hx; Vitamin D deficiency; Medication management; Environmental allergies; Hyperlipidemia; Reaction, adjustment, with anxious, depressed mood; and Hemoglobinuria on their problem list.   Married, two grown boys with 2 y/o twin granddaughters (in [place]), property accountant  Denies concerns though states feeling somewhat anxious and down this year with pandemic, and son just told her last month he is going through divorce. She reports some difficulty sleeping. She also endorses some hot flashes, taking OTC supplement per GYN recommendation with minimal benefit.   She is followed annually by GYN Dr. Ouida Sills at [facility] and performs monthly breast checks.   She was referred to urology/nephrology for concerns of ongoing hemoglobinuria,  She had serum workup and kidney ultrasound by Dr. Hollie Salk in 04/2018 with benign workup and recommended monitoring; also had negative cysto with Dr. Junious Silk and will continue to follow up annually. Last UA in 08/2018 was negative for hematuria.   BMI is Body mass index is 24.36 kg/m., she has been working on diet and exercise, started weight watchers after weight getting up to 139 lb, goal is 117 lb. Working from home, walking at least 30 min 4 days a week.  Wt Readings from Last 3 Encounters:  09/02/19 126 lb 12.8 oz (  57.5 kg)  12/29/18 125 lb (56.7 kg)  08/28/18 121 lb (54.9 kg)   Her blood pressure today is BP: 120/72 She does workout. She denies chest pain, shortness of breath, dizziness.   She is not on cholesterol medication and denies myalgias. Her cholesterol is not at goal. The cholesterol last visit was:   Lab Results  Component Value Date   CHOL 221 (H)  08/28/2018   HDL 92 08/28/2018   LDLCALC 112 (H) 08/28/2018   TRIG 83 08/28/2018   CHOLHDL 2.4 08/28/2018   She has been working on diet and exercise for history of insulin resistance. Last A1C in the office was:  Lab Results  Component Value Date   HGBA1C 5.0 08/28/2018   Last GFR: Lab Results  Component Value Date   GFRNONAA 85 08/28/2018   Patient is on Vitamin D supplement (2000 IU daily) and at goal:   Lab Results  Component Value Date   VD25OH 60 08/28/2018      Current Medications:  Current Outpatient Medications on File Prior to Visit  Medication Sig Dispense Refill  . b complex vitamins tablet Take 1 tablet by mouth daily.    . cetirizine (ZYRTEC) 10 MG tablet Take 10 mg by mouth daily. OTC    . Cholecalciferol (VITAMIN D PO) Take 2,000 Int'l Units by mouth daily.     . Nutritional Supplements (ESTROVEN PO) Take by mouth daily.    . TURMERIC PO Take 1 tablet by mouth daily.    . valACYclovir (VALTREX) 1000 MG tablet Take 1 tablet (1,000 mg total) by mouth 3 (three) times daily. Take for 7 days 21 tablet 0  . vitamin C (ASCORBIC ACID) 500 MG tablet Take 500 mg by mouth daily.    . predniSONE (DELTASONE) 20 MG tablet 2 tablets daily for 3 days, 1 tablet daily for 4 days. 10 tablet 0   No current facility-administered medications on file prior to visit.    Allergies:  No Known Allergies Medical History:  She has Insulin resistance, Hx; Vitamin D deficiency; Medication management; Environmental allergies; Hyperlipidemia; Reaction, adjustment, with anxious, depressed mood; and Hemoglobinuria on their problem list. Health Maintenance:   Immunization History  Administered Date(s) Administered  . Influenza-Unspecified 08/01/2016, 08/07/2017, 08/06/2018  . PPD Test 07/05/2014, 07/07/2015, 08/06/2016  . Pneumococcal Polysaccharide-23 06/01/2010  . Td 10/22/2004  . Tdap 07/07/2015    Tetanus: 2016 Flu vaccine: 2020 at work Shingrix: will check with insurance about  shingrix   LMP: Patient's last menstrual period was 08/20/2017. Pap: 06/2019 - by GYN Dr. Ouida Sills MGM: 2020 by GYN DEXA: n/a  Colonoscopy: DUE - refer to GI for colonoscopy  EGD: n/a  Last Dental Exam: Dr. Lavonne Chick, 2020, q 6 months Last Eye Exam: 08/2019, wears contacts, no issues, goes annually Last Derm Exam: Dr. Chana Bode, goes q12 months, no concerning lesions  Patient Care Team: Unk Pinto, MD as PCP - General (Internal Medicine) Deirdre Pippins, PA-C as Physician Assistant (Internal Medicine) Festus Aloe, MD as Consulting Physician (Urology)  Surgical History:  She has a past surgical history that includes Anterior cruciate ligament repair (Left, 02/2017) and Wisdom tooth extraction. Family History:  Herfamily history includes Breast cancer (age of onset: 44) in her maternal aunt; Breast cancer (age of onset: 49) in her maternal grandmother; Diabetes type II in her maternal grandfather; Heart disease in her maternal grandfather and maternal grandmother; Hypertension in her mother. Social History:  She reports that she has never smoked. She has never used  smokeless tobacco. She reports that she does not drink alcohol or use drugs.  Review of Systems: Review of Systems  Constitutional: Negative for malaise/fatigue and weight loss.  HENT: Negative for hearing loss and tinnitus.   Eyes: Negative for blurred vision and double vision.  Respiratory: Negative for cough, shortness of breath and wheezing.   Cardiovascular: Negative for chest pain, palpitations, orthopnea, claudication and leg swelling.  Gastrointestinal: Negative for abdominal pain, blood in stool, constipation, diarrhea, heartburn, melena, nausea and vomiting.  Genitourinary: Negative.   Musculoskeletal: Negative for joint pain and myalgias.  Skin: Negative for rash.  Neurological: Negative for dizziness, tingling, sensory change, weakness and headaches.  Endo/Heme/Allergies: Negative for  polydipsia.  Psychiatric/Behavioral: Negative for depression, substance abuse and suicidal ideas. The patient is nervous/anxious and has insomnia.   All other systems reviewed and are negative.   Physical Exam: Estimated body mass index is 24.36 kg/m as calculated from the following:   Height as of this encounter: 5' 0.5" (1.537 m).   Weight as of this encounter: 126 lb 12.8 oz (57.5 kg). BP 120/72   Pulse 82   Temp (!) 97.3 F (36.3 C)   Ht 5' 0.5" (1.537 m)   Wt 126 lb 12.8 oz (57.5 kg)   LMP 08/20/2017   SpO2 98%   BMI 24.36 kg/m  General Appearance: Well nourished, in no apparent distress.  Eyes: PERRLA, EOMs, conjunctiva no swelling or erythema, normal fundi and vessels.  Sinuses: No Frontal/maxillary tenderness  ENT/Mouth: Ext aud canals clear, normal light reflex with TMs without erythema, bulging. Good dentition. No erythema, swelling, or exudate on post pharynx. Tonsils not swollen or erythematous. Hearing normal.  Neck: Supple, thyroid normal. No bruits  Respiratory: Respiratory effort normal, BS equal bilaterally without rales, rhonchi, wheezing or stridor.  Cardio: RRR without murmurs, rubs or gallops. Brisk peripheral pulses without edema.  Chest: symmetric, with normal excursions and percussion.  Breasts: defer to GYN Abdomen: Soft, nontender, no guarding, rebound, hernias, masses, or organomegaly.  Lymphatics: Non tender without lymphadenopathy.  Genitourinary: defer to GYN Musculoskeletal: Full ROM all peripheral extremities,5/5 strength, and normal gait.  Skin: Warm, dry without rashes, lesions, ecchymosis. Neuro: Cranial nerves intact, reflexes equal bilaterally. Normal muscle tone, no cerebellar symptoms. Sensation intact.  Psych: Awake and oriented X 3, normal affect, Insight and Judgment appropriate.   EKG: WNL no ST changes, RSR' V1  Gorden Harms Rayne Loiseau 4:27 PM [facility]

## 2019-09-02 ENCOUNTER — Ambulatory Visit: Payer: BC Managed Care – PPO | Admitting: Adult Health

## 2019-09-02 ENCOUNTER — Other Ambulatory Visit: Payer: Self-pay

## 2019-09-02 ENCOUNTER — Encounter: Payer: Self-pay | Admitting: Adult Health

## 2019-09-02 VITALS — BP 120/72 | HR 82 | Temp 97.3°F | Ht 60.5 in | Wt 126.8 lb

## 2019-09-02 DIAGNOSIS — Z1211 Encounter for screening for malignant neoplasm of colon: Secondary | ICD-10-CM

## 2019-09-02 DIAGNOSIS — E559 Vitamin D deficiency, unspecified: Secondary | ICD-10-CM | POA: Diagnosis not present

## 2019-09-02 DIAGNOSIS — E782 Mixed hyperlipidemia: Secondary | ICD-10-CM

## 2019-09-02 DIAGNOSIS — Z136 Encounter for screening for cardiovascular disorders: Secondary | ICD-10-CM

## 2019-09-02 DIAGNOSIS — Z1329 Encounter for screening for other suspected endocrine disorder: Secondary | ICD-10-CM | POA: Diagnosis not present

## 2019-09-02 DIAGNOSIS — Z13220 Encounter for screening for lipoid disorders: Secondary | ICD-10-CM

## 2019-09-02 DIAGNOSIS — Z1389 Encounter for screening for other disorder: Secondary | ICD-10-CM | POA: Diagnosis not present

## 2019-09-02 DIAGNOSIS — R823 Hemoglobinuria: Secondary | ICD-10-CM

## 2019-09-02 DIAGNOSIS — Z131 Encounter for screening for diabetes mellitus: Secondary | ICD-10-CM

## 2019-09-02 DIAGNOSIS — Z8249 Family history of ischemic heart disease and other diseases of the circulatory system: Secondary | ICD-10-CM | POA: Diagnosis not present

## 2019-09-02 DIAGNOSIS — Z114 Encounter for screening for human immunodeficiency virus [HIV]: Secondary | ICD-10-CM

## 2019-09-02 DIAGNOSIS — Z79899 Other long term (current) drug therapy: Secondary | ICD-10-CM

## 2019-09-02 DIAGNOSIS — Z0000 Encounter for general adult medical examination without abnormal findings: Secondary | ICD-10-CM

## 2019-09-02 DIAGNOSIS — E8881 Metabolic syndrome: Secondary | ICD-10-CM

## 2019-09-02 DIAGNOSIS — F4323 Adjustment disorder with mixed anxiety and depressed mood: Secondary | ICD-10-CM

## 2019-09-02 DIAGNOSIS — Z9109 Other allergy status, other than to drugs and biological substances: Secondary | ICD-10-CM

## 2019-09-02 DIAGNOSIS — E88819 Insulin resistance, unspecified: Secondary | ICD-10-CM

## 2019-09-02 HISTORY — DX: Adjustment disorder with mixed anxiety and depressed mood: F43.23

## 2019-09-02 HISTORY — DX: Hemoglobinuria: R82.3

## 2019-09-02 MED ORDER — ESCITALOPRAM OXALATE 10 MG PO TABS: 10.0000 mg | ORAL_TABLET | Freq: Every day | ORAL | 0 refills | Status: DC

## 2019-09-02 NOTE — Patient Instructions (Addendum)
Ms. Otremba , Thank you for taking time to come for your Annual Wellness Visit. I appreciate your ongoing commitment to your health goals. Please review the following plan we discussed and let me know if I can assist you in the future.   These are the goals we discussed: Goals    . Exercise 150 min/wk Moderate Activity     Aim for 2-3 times of weights/ strength training       This is a list of the screening recommended for you and due dates:  Health Maintenance  Topic Date Due  . HIV Screening  11/03/1982  . Colon Cancer Screening  11/03/2017  . Mammogram  10/23/2019  . Pap Smear  10/22/2020  . Tetanus Vaccine  07/06/2025  . Flu Shot  Completed     Please call insurance and ask if they cover - if they do, can get at [facility] or [facility]   2 shots 2-6 months    Can try melatonin 5mg -15 mg at night for sleep, can also do benadryl 25-50mg  at night for sleep.  If this does not help we can try prescription medication.  Also here is some information about good sleep hygiene.    Know what a healthy weight is for you (roughly BMI <25) and aim to maintain this  Aim for 7+ servings of fruits and vegetables daily  65-80+ fluid ounces of water or unsweet tea for healthy kidneys  Limit to max 1 drink of alcohol per day; avoid smoking/tobacco  Limit animal fats in diet for cholesterol and heart health - choose grass fed whenever available  Avoid highly processed foods, and foods high in saturated/trans fats  Aim for low stress - take time to unwind and care for your mental health  Aim for 150 min of moderate intensity exercise weekly for heart health, and weights twice weekly for bone health  Aim for 7-9 hours of sleep daily     Escitalopram tablets What is this medicine? ESCITALOPRAM (es sye TAL oh pram) is used to treat depression and certain types of anxiety. This medicine may be used for other purposes; ask your health care provider or pharmacist if you have  questions. COMMON BRAND NAME(S): Lexapro What should I tell my health care provider before I take this medicine? They need to know if you have any of these conditions:  bipolar disorder or a family history of bipolar disorder  diabetes  glaucoma  heart disease  kidney or liver disease  receiving electroconvulsive therapy  seizures (convulsions)  suicidal thoughts, plans, or attempt by you or a family member  an unusual or allergic reaction to escitalopram, the related drug citalopram, other medicines, foods, dyes, or preservatives  pregnant or trying to become pregnant  breast-feeding How should I use this medicine? Take this medicine by mouth with a glass of water. Follow the directions on the prescription label. You can take it with or without food. If it upsets your stomach, take it with food. Take your medicine at regular intervals. Do not take it more often than directed. Do not stop taking this medicine suddenly except upon the advice of your doctor. Stopping this medicine too quickly may cause serious side effects or your condition may worsen. A special MedGuide will be given to you by the pharmacist with each prescription and refill. Be sure to read this information carefully each time. Talk to your pediatrician regarding the use of this medicine in children. Special care may be needed. Overdosage: If  you think you have taken too much of this medicine contact a poison control center or emergency room at once. NOTE: This medicine is only for you. Do not share this medicine with others. What if I miss a dose? If you miss a dose, take it as soon as you can. If it is almost time for your next dose, take only that dose. Do not take double or extra doses. What may interact with this medicine? Do not take this medicine with any of the following medications:  certain medicines for fungal infections like fluconazole, itraconazole, ketoconazole, posaconazole,  voriconazole  cisapride  citalopram  dronedarone  linezolid  MAOIs like Carbex, Eldepryl, Marplan, Nardil, and Parnate  methylene blue (injected into a vein)  pimozide  thioridazine This medicine may also interact with the following medications:  alcohol  amphetamines  aspirin and aspirin-like medicines  carbamazepine  certain medicines for depression, anxiety, or psychotic disturbances  certain medicines for migraine headache like almotriptan, eletriptan, frovatriptan, naratriptan, rizatriptan, sumatriptan, zolmitriptan  certain medicines for sleep  certain medicines that treat or prevent blood clots like warfarin, enoxaparin, dalteparin  cimetidine  diuretics  dofetilide  fentanyl  furazolidone  isoniazid  lithium  metoprolol  NSAIDs, medicines for pain and inflammation, like ibuprofen or naproxen  other medicines that prolong the QT interval (cause an abnormal heart rhythm)  procarbazine  rasagiline  supplements like St. John's wort, kava kava, valerian  tramadol  tryptophan  ziprasidone This list may not describe all possible interactions. Give your health care provider a list of all the medicines, herbs, non-prescription drugs, or dietary supplements you use. Also tell them if you smoke, drink alcohol, or use illegal drugs. Some items may interact with your medicine. What should I watch for while using this medicine? Tell your doctor if your symptoms do not get better or if they get worse. Visit your doctor or health care professional for regular checks on your progress. Because it may take several weeks to see the full effects of this medicine, it is important to continue your treatment as prescribed by your doctor. Patients and their families should watch out for new or worsening thoughts of suicide or depression. Also watch out for sudden changes in feelings such as feeling anxious, agitated, panicky, irritable, hostile, aggressive,  impulsive, severely restless, overly excited and hyperactive, or not being able to sleep. If this happens, especially at the beginning of treatment or after a change in dose, call your health care professional. Dennis Bast may get drowsy or dizzy. Do not drive, use machinery, or do anything that needs mental alertness until you know how this medicine affects you. Do not stand or sit up quickly, especially if you are an older patient. This reduces the risk of dizzy or fainting spells. Alcohol may interfere with the effect of this medicine. Avoid alcoholic drinks. Your mouth may get dry. Chewing sugarless gum or sucking hard candy, and drinking plenty of water may help. Contact your doctor if the problem does not go away or is severe. What side effects may I notice from receiving this medicine? Side effects that you should report to your doctor or health care professional as soon as possible:  allergic reactions like skin rash, itching or hives, swelling of the face, lips, or tongue  anxious  black, tarry stools  changes in vision  confusion  elevated mood, decreased need for sleep, racing thoughts, impulsive behavior  eye pain  fast, irregular heartbeat  feeling faint or lightheaded, falls  feeling  agitated, angry, or irritable  hallucination, loss of contact with reality  loss of balance or coordination  loss of memory  painful or prolonged erections  restlessness, pacing, inability to keep still  seizures  stiff muscles  suicidal thoughts or other mood changes  trouble sleeping  unusual bleeding or bruising  unusually weak or tired  vomiting Side effects that usually do not require medical attention (report to your doctor or health care professional if they continue or are bothersome):  changes in appetite  change in sex drive or performance  headache  increased sweating  indigestion, nausea  tremors This list may not describe all possible side effects. Call  your doctor for medical advice about side effects. You may report side effects to FDA at 1-800-FDA-1088. Where should I keep my medicine? Keep out of reach of children. Store at room temperature between 15 and 30 degrees C (59 and 86 degrees F). Throw away any unused medicine after the expiration date. NOTE: This sheet is a summary. It may not cover all possible information. If you have questions about this medicine, talk to your doctor, pharmacist, or health care provider.  2020 Elsevier/Gold Standard (2018-09-29 11:21:44)    Can try melatonin 5mg -15 mg at night for sleep, can also do benadryl 25-50mg  at night for sleep.  If this does not help we can try prescription medication.  Also here is some information about good sleep hygiene.   Insomnia Insomnia is frequent trouble falling and/or staying asleep. Insomnia can be a long term problem or a short term problem. Both are common. Insomnia can be a short term problem when the wakefulness is related to a certain stress or worry. Long term insomnia is often related to ongoing stress during waking hours and/or poor sleeping habits. Overtime, sleep deprivation itself can make the problem worse. Every little thing feels more severe because you are overtired and your ability to cope is decreased. CAUSES   Stress, anxiety, and depression.  Poor sleeping habits.  Distractions such as TV in the bedroom.  Naps close to bedtime.  Engaging in emotionally charged conversations before bed.  Technical reading before sleep.  Alcohol and other sedatives. They may make the problem worse. They can hurt normal sleep patterns and normal dream activity.  Stimulants such as caffeine for several hours prior to bedtime.  Pain syndromes and shortness of breath can cause insomnia.  Exercise late at night.  Changing time zones may cause sleeping problems (jet lag). It is sometimes helpful to have someone observe your sleeping patterns. They should look  for periods of not breathing during the night (sleep apnea). They should also look to see how long those periods last. If you live alone or observers are uncertain, you can also be observed at a sleep clinic where your sleep patterns will be professionally monitored. Sleep apnea requires a checkup and treatment. Give your caregivers your medical history. Give your caregivers observations your family has made about your sleep.  SYMPTOMS   Not feeling rested in the morning.  Anxiety and restlessness at bedtime.  Difficulty falling and staying asleep. TREATMENT   Your caregiver may prescribe treatment for an underlying medical disorders. Your caregiver can give advice or help if you are using alcohol or other drugs for self-medication. Treatment of underlying problems will usually eliminate insomnia problems.  Medications can be prescribed for short time use. They are generally not recommended for lengthy use.  Over-the-counter sleep medicines are not recommended for lengthy use. They can  be habit forming.  You can promote easier sleeping by making lifestyle changes such as:  Using relaxation techniques that help with breathing and reduce muscle tension.  Exercising earlier in the day.  Changing your diet and the time of your last meal. No night time snacks.  Establish a regular time to go to bed.  Counseling can help with stressful problems and worry.  Soothing music and white noise may be helpful if there are background noises you cannot remove.  Stop tedious detailed work at least one hour before bedtime. HOME CARE INSTRUCTIONS   Keep a diary. Inform your caregiver about your progress. This includes any medication side effects. See your caregiver regularly. Take note of:  Times when you are asleep.  Times when you are awake during the night.  The quality of your sleep.  How you feel the next day. This information will help your caregiver care for you.  Get out of bed if  you are still awake after 15 minutes. Read or do some quiet activity. Keep the lights down. Wait until you feel sleepy and go back to bed.  Keep regular sleeping and waking hours. Avoid naps.  Exercise regularly.  Avoid distractions at bedtime. Distractions include watching television or engaging in any intense or detailed activity like attempting to balance the household checkbook.  Develop a bedtime ritual. Keep a familiar routine of bathing, brushing your teeth, climbing into bed at the same time each night, listening to soothing music. Routines increase the success of falling to sleep faster.  Use relaxation techniques. This can be using breathing and muscle tension release routines. It can also include visualizing peaceful scenes. You can also help control troubling or intruding thoughts by keeping your mind occupied with boring or repetitive thoughts like the old concept of counting sheep. You can make it more creative like imagining planting one beautiful flower after another in your backyard garden.  During your day, work to eliminate stress. When this is not possible use some of the previous suggestions to help reduce the anxiety that accompanies stressful situations. MAKE SURE YOU:   Understand these instructions.  Will watch your condition.  Will get help right away if you are not doing well or get worse. Document Released: 10/05/2000 Document Revised: 12/31/2011 Document Reviewed: 11/05/2007 [facility] Patient Information 2015 [place], [place]. This information is not intended to replace advice given to you by your health care provider. Make sure you discuss any questions you have with your health care provider.

## 2019-09-03 ENCOUNTER — Encounter: Payer: Self-pay | Admitting: Adult Health

## 2019-09-03 ENCOUNTER — Encounter: Payer: Self-pay | Admitting: Internal Medicine

## 2019-09-03 LAB — CBC WITH DIFFERENTIAL/PLATELET
Absolute Monocytes: 662 cells/uL (ref 200–950)
Basophils Absolute: 48 cells/uL (ref 0–200)
Basophils Relative: 0.5 %
Eosinophils Absolute: 144 cells/uL (ref 15–500)
Eosinophils Relative: 1.5 %
HCT: 41 % (ref 35.0–45.0)
Hemoglobin: 13.5 g/dL (ref 11.7–15.5)
Lymphs Abs: 2698 cells/uL (ref 850–3900)
MCH: 29.6 pg (ref 27.0–33.0)
MCHC: 32.9 g/dL (ref 32.0–36.0)
MCV: 89.9 fL (ref 80.0–100.0)
MPV: 10.9 fL (ref 7.5–12.5)
Monocytes Relative: 6.9 %
Neutro Abs: 6048 cells/uL (ref 1500–7800)
Neutrophils Relative %: 63 %
Platelets: 332 10*3/uL (ref 140–400)
RBC: 4.56 10*6/uL (ref 3.80–5.10)
RDW: 12.5 % (ref 11.0–15.0)
Total Lymphocyte: 28.1 %
WBC: 9.6 10*3/uL (ref 3.8–10.8)

## 2019-09-03 LAB — URINALYSIS, ROUTINE W REFLEX MICROSCOPIC
Bilirubin Urine: NEGATIVE
Glucose, UA: NEGATIVE
Hgb urine dipstick: NEGATIVE
Ketones, ur: NEGATIVE
Leukocytes,Ua: NEGATIVE
Nitrite: NEGATIVE
Protein, ur: NEGATIVE
Specific Gravity, Urine: 1.005 (ref 1.001–1.03)
pH: 6.5 (ref 5.0–8.0)

## 2019-09-03 LAB — COMPLETE METABOLIC PANEL WITH GFR
AG Ratio: 1.9 (calc) (ref 1.0–2.5)
ALT: 16 U/L (ref 6–29)
AST: 19 U/L (ref 10–35)
Albumin: 4.7 g/dL (ref 3.6–5.1)
Alkaline phosphatase (APISO): 67 U/L (ref 37–153)
BUN: 16 mg/dL (ref 7–25)
CO2: 29 mmol/L (ref 20–32)
Calcium: 9.8 mg/dL (ref 8.6–10.4)
Chloride: 100 mmol/L (ref 98–110)
Creat: 0.71 mg/dL (ref 0.50–1.05)
GFR, Est African American: 114 mL/min/{1.73_m2} (ref >=60)
GFR, Est Non African American: 99 mL/min/{1.73_m2} (ref >=60)
Globulin: 2.5 g/dL (calc) (ref 1.9–3.7)
Glucose, Bld: 93 mg/dL (ref 65–99)
Potassium: 4.4 mmol/L (ref 3.5–5.3)
Sodium: 138 mmol/L (ref 135–146)
Total Bilirubin: 0.3 mg/dL (ref 0.2–1.2)
Total Protein: 7.2 g/dL (ref 6.1–8.1)

## 2019-09-03 LAB — HEMOGLOBIN A1C
Hgb A1c MFr Bld: 5.1 % of total Hgb (ref <5.7)
Mean Plasma Glucose: 100 (calc)
eAG (mmol/L): 5.5 (calc)

## 2019-09-03 LAB — LIPID PANEL
Cholesterol: 194 mg/dL (ref <200)
HDL: 79 mg/dL (ref >=50)
LDL Cholesterol (Calc): 97 mg/dL (calc)
Non-HDL Cholesterol (Calc): 115 mg/dL (calc) (ref <130)
Total CHOL/HDL Ratio: 2.5 (calc) (ref <5.0)
Triglycerides: 90 mg/dL (ref <150)

## 2019-09-03 LAB — HIV ANTIBODY (ROUTINE TESTING W REFLEX): HIV 1&2 Ab, 4th Generation: NONREACTIVE

## 2019-09-03 LAB — VITAMIN D 25 HYDROXY (VIT D DEFICIENCY, FRACTURES): Vit D, 25-Hydroxy: 54 ng/mL (ref 30–100)

## 2019-09-03 LAB — TSH: TSH: 1.62 mIU/L

## 2019-10-09 ENCOUNTER — Encounter: Payer: BLUE CROSS/BLUE SHIELD | Admitting: Internal Medicine

## 2019-11-02 ENCOUNTER — Encounter: Payer: BLUE CROSS/BLUE SHIELD | Admitting: Internal Medicine

## 2019-11-24 ENCOUNTER — Other Ambulatory Visit: Payer: Self-pay | Admitting: Adult Health

## 2019-11-24 DIAGNOSIS — F4323 Adjustment disorder with mixed anxiety and depressed mood: Secondary | ICD-10-CM

## 2019-12-04 NOTE — Progress Notes (Deleted)
Assessment and Plan:  1. Reaction, adjustment, with anxious, depressed mood ***  2. Vitamin D deficiency ***  3. BMI 24.0-24.9, adult ***     Further disposition pending results of labs. Discussed med's effects and SE's.   Over 30 minutes of exam, counseling, chart review, and critical decision making was performed.   Future Appointments  Date Time Provider [place]  12/07/2019  3:45 PM Liane Comber, NP [facility] None  09/01/2020  3:00 PM Liane Comber, NP [facility] None    ------------------------------------------------------------------------------------------------------------------   HPI LMP 08/20/2017   54 y.o.female presents for 3 month follow up on anxiety after initiation of new medication.   At her CPE she reported feeling somewhat anxious and down this year with pandemic, and son just told her h he was going through divorce. She reported some difficulty sleeping. She also endorses some hot flashes, taking OTC supplement per GYN recommendation with minimal benefit. PHQ-9 score of 7 at that time. Lab workup at that time including CBC, CMP, TSH, vit D were normal. We discussed and initiated lexapro 10 mg dialy for possible benefit with mood and hot flashes. ***   Her blood pressure {HAS HAS NOT:18834} been controlled at home, today their BP is    She {DOES_DOES [id number] workout. She denies chest pain, shortness of breath, dizziness.  Patient is on Vitamin D supplement.   Lab Results  Component Value Date   VD25OH 54 09/02/2019        Past Medical History:  Diagnosis Date  . Gestational diabetes mellitus, Hx 07/05/2014  . 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 b complex vitamins tablet Take 1 tablet by mouth daily.  . cetirizine (ZYRTEC) 10 MG tablet Take 10 mg by mouth daily. OTC  . Cholecalciferol (VITAMIN D PO) Take 2,000 Int'l Units by mouth daily.    Marland Kitchen escitalopram (LEXAPRO) 10 MG tablet Take 1 tablet Daily for Mood  . Nutritional Supplements (ESTROVEN PO) Take by mouth daily.  . predniSONE (DELTASONE) 20 MG tablet 2 tablets daily for 3 days, 1 tablet daily for 4 days.  . TURMERIC PO Take 1 tablet by mouth daily.  . valACYclovir (VALTREX) 1000 MG tablet Take 1 tablet (1,000 mg total) by mouth 3 (three) times daily. Take for 7 days  . vitamin C (ASCORBIC ACID) 500 MG tablet Take 500 mg by mouth daily.   No current facility-administered medications on file prior to visit.    ROS: all negative except above.   Physical Exam:  LMP 08/20/2017   General Appearance: Well nourished, in no apparent distress. Eyes: PERRLA, EOMs, conjunctiva no swelling or erythema Sinuses: No Frontal/maxillary tenderness ENT/Mouth: Ext aud canals clear, TMs without erythema, bulging. No erythema, swelling, or exudate on post pharynx.  Tonsils not swollen or erythematous. Hearing normal.  Neck: Supple, thyroid normal.  Respiratory: Respiratory effort normal, BS equal bilaterally without rales, rhonchi, wheezing or stridor.  Cardio: RRR with no MRGs. Brisk peripheral pulses without edema.  Abdomen: Soft, + BS.  Non tender, no guarding, rebound, hernias, masses. Lymphatics: Non tender without lymphadenopathy.  Musculoskeletal: Full ROM, 5/5 strength, normal gait.  Skin: Warm, dry without rashes, lesions, ecchymosis.  Neuro: Cranial nerves intact. Normal muscle tone, no cerebellar symptoms. Sensation intact.  Psych: Awake and oriented X 3, normal affect, Insight and Judgment appropriate.     Tricia Ribas, NP 2:52 PM [facility]

## 2019-12-07 ENCOUNTER — Ambulatory Visit: Payer: BC Managed Care – PPO | Admitting: Adult Health

## 2019-12-15 ENCOUNTER — Ambulatory Visit: Payer: BC Managed Care – PPO | Admitting: Adult Health

## 2019-12-21 ENCOUNTER — Encounter: Payer: Self-pay | Admitting: Adult Health

## 2019-12-29 NOTE — Progress Notes (Deleted)
Assessment and Plan:  Diagnoses and all orders for this visit:  Reaction, adjustment, with anxious, depressed mood  Mixed hyperlipidemia  Insulin resistance, Hx  Medication management  Vitamin D deficiency  BMI 24.0-24.9, adult     Further disposition pending results of labs. Discussed med's effects and SE's.   Over 30 minutes of exam, counseling, chart review, and critical decision making was performed.   Future Appointments  Date Time Provider [place]  12/30/2019  4:15 PM Liane Comber, NP [facility] None  09/01/2020  3:00 PM Liane Comber, NP [facility] None    ------------------------------------------------------------------------------------------------------------------   HPI LMP 08/20/2017   54 y.o.female presents for 3 month follow up on anxiety after initiation of new medication.   *** colonoscopy  Mammogram by GYN ** report   At her CPE she reported feeling somewhat anxious and down this year with pandemic, and son just told her h he was going through divorce. She reported some difficulty sleeping. She also endorses some hot flashes, taking OTC supplement per GYN recommendation with minimal benefit. PHQ-9 score of 7 at that time. Lab workup at that time including CBC, CMP, TSH, vit D were normal. We discussed and initiated lexapro 10 mg dialy for possible benefit with mood and hot flashes. ***   Today their BP is    She {DOES_DOES [id number] workout. She denies chest pain, shortness of breath, dizziness.   She has hx of mild hyperlipidemia recently well controlled by lifestyle. Her cholesterol {ACTION; IS/IS [id number]} at goal. The cholesterol last visit was:   Lab Results  Component Value Date   CHOL 194 09/02/2019   HDL 79 09/02/2019   LDLCALC 97 09/02/2019   TRIG 90 09/02/2019   CHOLHDL 2.5 09/02/2019    She has been working on diet and exercise for hx of insulin resistance. Last A1C in the office was:  Lab Results  Component  Value Date   HGBA1C 5.1 09/02/2019   Patient is on Vitamin D supplement.   Lab Results  Component Value Date   VD25OH 54 09/02/2019        Past Medical History:  Diagnosis Date  . Gestational diabetes mellitus, Hx 07/05/2014  . 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 b complex vitamins tablet Take 1 tablet by mouth daily.  . cetirizine (ZYRTEC) 10 MG tablet Take 10 mg by mouth daily. OTC  . Cholecalciferol (VITAMIN D PO) Take 2,000 Int'l Units by mouth daily.   Marland Kitchen escitalopram (LEXAPRO) 10 MG tablet Take 1 tablet Daily for Mood  . Nutritional Supplements (ESTROVEN PO) Take by mouth daily.  . predniSONE (DELTASONE) 20 MG tablet 2 tablets daily for 3 days, 1 tablet daily for 4 days.  . TURMERIC PO Take 1 tablet by mouth daily.  . valACYclovir (VALTREX) 1000 MG tablet Take 1 tablet (1,000 mg total) by mouth 3 (three) times daily. Take for 7 days  . vitamin C (ASCORBIC ACID) 500 MG tablet Take 500 mg by mouth daily.   No current facility-administered medications on file prior to visit.    ROS: all negative except above.   Physical Exam:  LMP 08/20/2017   General Appearance: Well nourished, in no apparent distress. Eyes: PERRLA, EOMs, conjunctiva no swelling or erythema Sinuses: No Frontal/maxillary tenderness ENT/Mouth: Ext aud canals clear, TMs without erythema, bulging. No erythema, swelling, or exudate on post pharynx.  Tonsils not swollen or erythematous. Hearing normal.  Neck:  Supple, thyroid normal.  Respiratory: Respiratory effort normal, BS equal bilaterally without rales, rhonchi, wheezing or stridor.  Cardio: RRR with no MRGs. Brisk peripheral pulses without edema.  Abdomen: Soft, + BS.  Non tender, no guarding, rebound, hernias, masses. Lymphatics: Non tender without lymphadenopathy.  Musculoskeletal: Full ROM, 5/5 strength, normal gait.  Skin: Warm, dry without rashes, lesions,  ecchymosis.  Neuro: Cranial nerves intact. Normal muscle tone, no cerebellar symptoms. Sensation intact.  Psych: Awake and oriented X 3, normal affect, Insight and Judgment appropriate.     Izora Ribas, NP 4:24 PM [facility]

## 2019-12-30 ENCOUNTER — Ambulatory Visit: Payer: BC Managed Care – PPO | Admitting: Adult Health

## 2020-01-06 NOTE — Progress Notes (Deleted)
Assessment and Plan:  Diagnoses and all orders for this visit:  Reaction, adjustment, with anxious, depressed mood  Medication management  BMI 24.0-24.9, adult     Further disposition pending results of labs. Discussed med's effects and SE's.   Over 30 minutes of exam, counseling, chart review, and critical decision making was performed.   Future Appointments  Date Time Provider [place]  01/07/2020  4:15 PM Liane Comber, Tricia Berger [facility] None  09/01/2020  3:00 PM Liane Comber, Tricia Berger [facility] None    ------------------------------------------------------------------------------------------------------------------   HPI LMP 08/20/2017   54 y.o.female presents for 3 month follow up on anxiety after initiation of new medication.   *** colonoscopy  Mammogram by GYN ** report   At her CPE she reported feeling somewhat anxious and down this year with pandemic, and son just told her h he was going through divorce. She reported some difficulty sleeping. She also endorses some hot flashes, taking OTC supplement per GYN recommendation with minimal benefit. PHQ-9 score of 7 at that time. Lab workup at that time including CBC, CMP, TSH, vit D were normal. We discussed and initiated lexapro 10 mg dialy for possible benefit with mood and hot flashes. ***   Today their BP is    She {DOES_DOES [id number] workout. She denies chest pain, shortness of breath, dizziness.    BMI is There is no height or weight on file to calculate BMI., she {HAS HAS [id number] been working on diet and exercise. Wt Readings from Last 3 Encounters:  09/02/19 126 lb 12.8 oz (57.5 kg)  12/29/18 125 lb (56.7 kg)  08/28/18 121 lb (54.9 kg)      Past Medical History:  Diagnosis Date  . Gestational diabetes mellitus, Hx 07/05/2014  . 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 b complex vitamins tablet Take 1  tablet by mouth daily.  . cetirizine (ZYRTEC) 10 MG tablet Take 10 mg by mouth daily. OTC  . Cholecalciferol (VITAMIN D PO) Take 2,000 Int'l Units by mouth daily.   Marland Kitchen escitalopram (LEXAPRO) 10 MG tablet Take 1 tablet Daily for Mood  . Nutritional Supplements (ESTROVEN PO) Take by mouth daily.  . predniSONE (DELTASONE) 20 MG tablet 2 tablets daily for 3 days, 1 tablet daily for 4 days.  . TURMERIC PO Take 1 tablet by mouth daily.  . valACYclovir (VALTREX) 1000 MG tablet Take 1 tablet (1,000 mg total) by mouth 3 (three) times daily. Take for 7 days  . vitamin C (ASCORBIC ACID) 500 MG tablet Take 500 mg by mouth daily.   No current facility-administered medications on file prior to visit.    ROS: all negative except above.   Physical Exam:  LMP 08/20/2017   General Appearance: Well nourished, in no apparent distress. Eyes: PERRLA, EOMs, conjunctiva no swelling or erythema Sinuses: No Frontal/maxillary tenderness ENT/Mouth: Ext aud canals clear, TMs without erythema, bulging. No erythema, swelling, or exudate on post pharynx.  Tonsils not swollen or erythematous. Hearing normal.  Neck: Supple, thyroid normal.  Respiratory: Respiratory effort normal, BS equal bilaterally without rales, rhonchi, wheezing or stridor.  Cardio: RRR with no MRGs. Brisk peripheral pulses without edema.  Abdomen: Soft, + BS.  Non tender, no guarding, rebound, hernias, masses. Lymphatics: Non tender without lymphadenopathy.  Musculoskeletal: Full ROM, 5/5 strength, normal gait.  Skin: Warm, dry without rashes, lesions, ecchymosis.  Neuro: Cranial nerves intact. Normal muscle tone, no cerebellar symptoms. Sensation  intact.  Psych: Awake and oriented X 3, normal affect, Insight and Judgment appropriate.     Tricia Ribas, Tricia Berger 2:05 PM [facility]

## 2020-01-07 ENCOUNTER — Ambulatory Visit: Payer: BC Managed Care – PPO | Admitting: Adult Health

## 2020-02-17 NOTE — Progress Notes (Signed)
FOLLOW UP  Assessment and Plan:   Cholesterol Currently improved and at goal with lifestyle modification  Continue low cholesterol diet and exercise.  Check lipid panel.   Abnormal glucose Recent A1Cs at goal Discussed diet/exercise, weight management  Defer A1C to CPE  BMI 24 Continue to recommend diet heavy in fruits and veggies and low in animal meats, cheeses, and dairy products, appropriate calorie intake Discuss exercise recommendations routinely Continue to monitor weight at each visit  Vitamin D Def Near goal at last visit; continue supplementation to maintain goal of 60-100 Defer Vit D level to CPE  Adjustment reaction, anxiety  She feels life situation is improved and wants to taper off of lexapro; 1/2 tab x 1-2 weeks then stop, follow up if mood is worse, may try effexor Stress management techniques discussed, increase water, good sleep hygiene discussed, increase exercise, and increase veggies.   Polyarthralgia/stiffness Symmetrical, bil knees, hips, hands, progressive/worse x 6 months with AM stiffness, accompanied by fatigue ? History of RA in MGM, recalls significant deviation deformities in hands Exam at this time is normal without effusion, heat, bony abnormality, significant stiffness Possible early autoimmune/inflammatory arthritis Denies any tick exposure, declines lymes check after discussion Suggested NSAIDs, consider imaging vs ortho/rheum if persistent/progressive -     CBC with Differential/Platelet -     Sedimentation rate -     C-reactive protein -     ANA -     Rheumatoid factor -     Anti-DNA antibody, double-stranded -     Cyclic citrul peptide antibody, IgG -     Anti-Smith antibody  Continue diet and meds as discussed. Further disposition pending results of labs. Discussed med's effects and SE's.   Over 30 minutes of exam, counseling, chart review, and critical decision making was performed.   Future Appointments  Date Time Provider  [place]  09/01/2020  3:00 PM Tricia Comber, NP [facility] None    ----------------------------------------------------------------------------------------------------------------------  HPI 54 y.o. female  presents for 6 month follow up on mood/stress, cholesterol, weight and vitamin D deficiency.   Married, two grown boys with 2 y/o twin granddaughters (in [place]), property accountant  At CPE in 2020 expressed feeling somewhat anxious and down this year with pandemic, son was going through divorce. She also reported hot flashes and some difficulty sleeping.  Tried OTC supplement per GYN recommendation without significant benefit. Discussed possible benefit from lexapro vs effexor, lexapro 10 mg daily was initiated. Patient reports she is feeling better, not sure that medication helped but feels contributed to weight gain and having atypical dreams and would like to discontinue.   She is R handed, sedentary job, working from home. She reports new bilateral knee, hip and hand pain and stiffness and discomfort without swelling that has gradually noticeably worsened over the last 6 months. Worst in the morning, improves in 30 min or so once she gets up and gets going, after warm shower also helps. Describes as "uncomfortable," only with movement. Will get stiff and feel achy if sits for extended period. Denies fever/chills, rash. Does endorse some fatigue. Struggles to characterize pain and pain number, "just worse and uncomfortable" "I feel like a creaky 54 year old woman." She reports maternal grandmother had arthritis, unsure of what type, with severe disfigurement in hands as she got older. No significant discomfort with walking other than in knees occasionally when climbing stairs. Has taken tylenol or ibuprofen infrequently, both do help.   BMI is Body mass index is 24.97  kg/m., she has been working on diet and exercise, less with the winter and more recently with pollen, but  does walk, trying to increase with new puppy.  Wt Readings from Last 3 Encounters:  02/18/20 130 lb (59 kg)  09/02/19 126 lb 12.8 oz (57.5 kg)  12/29/18 125 lb (56.7 kg)   Today their BP is BP: 114/70  She does workout. She denies chest pain, shortness of breath, dizziness.  Hx of hyperlipidemia though at goal with lifestyle modification at last check.  The cholesterol last visit was:   Lab Results  Component Value Date   CHOL 194 09/02/2019   HDL 79 09/02/2019   LDLCALC 97 09/02/2019   TRIG 90 09/02/2019   CHOLHDL 2.5 09/02/2019    She has been working on diet and exercise for hx of insulin resistance and gestational diabetes, and denies increased appetite, nausea, paresthesia of the feet, polydipsia, polyuria, visual disturbances and vomiting. Last A1C in the office was:  Lab Results  Component Value Date   HGBA1C 5.1 09/02/2019   Patient is on Vitamin D supplement.   Lab Results  Component Value Date   VD25OH 54 09/02/2019        Current Medications:  Current Outpatient Medications on File Prior to Visit  Medication Sig  . b complex vitamins tablet Take 1 tablet by mouth daily.  . cetirizine (ZYRTEC) 10 MG tablet Take 10 mg by mouth daily. OTC  . Cholecalciferol (VITAMIN D PO) Take 2,000 Int'l Units by mouth daily.   . TURMERIC PO Take 1 tablet by mouth daily.  . vitamin C (ASCORBIC ACID) 500 MG tablet Take 500 mg by mouth daily.  . Nutritional Supplements (ESTROVEN PO) Take by mouth daily.   No current facility-administered medications on file prior to visit.     Allergies: No Known Allergies   Medical History:  Past Medical History:  Diagnosis Date  . Gestational diabetes mellitus, Hx 07/05/2014  . 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  HENT: Negative for hearing loss and tinnitus.    Eyes: Negative for blurred vision and double vision.  Respiratory: Negative for cough, shortness of breath and wheezing.   Cardiovascular: Negative for chest pain, palpitations, orthopnea, claudication and leg swelling.  Gastrointestinal: Negative for abdominal pain, blood in stool, constipation, diarrhea, heartburn, melena, nausea and vomiting.  Genitourinary: Negative.   Musculoskeletal: Positive for joint pain (bil hips, knees, hands, symmetrical, with stiffness). Negative for falls and myalgias.  Skin: Negative for rash.  Neurological: Negative for dizziness, tingling, sensory change, weakness and headaches.  Endo/Heme/Allergies: Negative for polydipsia.  Psychiatric/Behavioral: Negative for depression and substance abuse. The patient is not nervous/anxious.   All other systems reviewed and are negative.     Physical Exam: BP 114/70   Pulse 76   Temp (!) 97.3 F (36.3 C)   Ht 5' 0.5" (1.537 m)   Wt 130 lb (59 kg)   LMP 08/20/2017   SpO2 97%   BMI 24.97 kg/m  Wt Readings from Last 3 Encounters:  02/18/20 130 lb (59 kg)  09/02/19 126 lb 12.8 oz (57.5 kg)  12/29/18 125 lb (56.7 kg)   General Appearance: Well nourished, in no apparent distress. Eyes: PERRLA, EOMs, conjunctiva no swelling or erythema Sinuses: No Frontal/maxillary tenderness ENT/Mouth: Ext aud canals clear, TMs without erythema, bulging. No erythema, swelling, or exudate on  post pharynx.  Tonsils not swollen or erythematous. Hearing normal.  Neck: Supple, thyroid normal.  Respiratory: Respiratory effort normal, BS equal bilaterally without rales, rhonchi, wheezing or stridor.  Cardio: RRR with no MRGs. Brisk peripheral pulses without edema.  Abdomen: Soft, + BS.  Non tender, no guarding, rebound, hernias, masses. Lymphatics: Non tender without lymphadenopathy.  Musculoskeletal: Full ROM, 5/5 strength, Normal gait. No notable joint deformity, effusion, heat, erythema. Bil hands mildly stiff.  Skin: Warm, dry  without rashes, lesions, ecchymosis. No pitting to nails.  Neuro: Cranial nerves intact. No cerebellar symptoms.  Psych: Awake and oriented X 3, normal affect, Insight and Judgment appropriate.    Tricia Ribas, NP 4:06 PM [facility]

## 2020-02-18 ENCOUNTER — Other Ambulatory Visit: Payer: Self-pay

## 2020-02-18 ENCOUNTER — Ambulatory Visit: Payer: BC Managed Care – PPO | Admitting: Adult Health

## 2020-02-18 ENCOUNTER — Encounter: Payer: Self-pay | Admitting: Adult Health

## 2020-02-18 VITALS — BP 114/70 | HR 76 | Temp 97.3°F | Ht 60.5 in | Wt 130.0 lb

## 2020-02-18 DIAGNOSIS — M2550 Pain in unspecified joint: Secondary | ICD-10-CM | POA: Insufficient documentation

## 2020-02-18 DIAGNOSIS — E782 Mixed hyperlipidemia: Secondary | ICD-10-CM | POA: Diagnosis not present

## 2020-02-18 DIAGNOSIS — Z6824 Body mass index (BMI) 24.0-24.9, adult: Secondary | ICD-10-CM

## 2020-02-18 DIAGNOSIS — F4323 Adjustment disorder with mixed anxiety and depressed mood: Secondary | ICD-10-CM

## 2020-02-18 DIAGNOSIS — M2560 Stiffness of unspecified joint, not elsewhere classified: Secondary | ICD-10-CM

## 2020-02-18 DIAGNOSIS — Z79899 Other long term (current) drug therapy: Secondary | ICD-10-CM | POA: Diagnosis not present

## 2020-02-18 DIAGNOSIS — E8881 Metabolic syndrome: Secondary | ICD-10-CM

## 2020-02-18 DIAGNOSIS — E559 Vitamin D deficiency, unspecified: Secondary | ICD-10-CM

## 2020-02-18 NOTE — Patient Instructions (Addendum)
Cut lexapro in 1/2 for 2 weeks then stop - message if mood is significantly worse, could try a different med such as effexor    Arthritis Arthritis is a term that is commonly used to refer to joint pain or joint disease. There are more than 100 types of arthritis. What are the causes? The most common cause of this condition is wear and tear of a joint. Other causes include:  Gout.  Inflammation of a joint.  An infection of a joint.  Sprains and other injuries near the joint.  A reaction to medicines or drugs, or an allergic reaction. In some cases, the cause may not be known. What are the signs or symptoms? The main symptom of this condition is pain in the joint during movement. Other symptoms include:  Redness, swelling, or stiffness at a joint.  Warmth coming from the joint.  Fever.  Overall feeling of illness. How is this diagnosed? This condition may be diagnosed with a physical exam and tests, including:  Blood tests.  Urine tests.  Imaging tests, such as X-rays, an MRI, or a CT scan. Sometimes, fluid is removed from a joint for testing. How is this treated? This condition may be treated with:  Treatment of the cause, if it is known.  Rest.  Raising (elevating) the joint.  Applying cold or hot packs to the joint.  Medicines to improve symptoms and reduce inflammation.  Injections of a steroid such as cortisone into the joint to help reduce pain and inflammation. Depending on the cause of your arthritis, you may need to make lifestyle changes to reduce stress on your joint. Changes may include:  Exercising more.  Losing weight. Follow these instructions at home: Medicines  Take over-the-counter and prescription medicines only as told by your health care provider.  Do not take aspirin to relieve pain if your health care provider thinks that gout may be causing your pain. Activity  Rest your joint if told by your health care provider. Rest is  important when your disease is active and your joint feels painful, swollen, or stiff.  Avoid activities that make the pain worse. It is important to balance activity with rest.  Exercise your joint regularly with range-of-motion exercises as told by your health care provider. Try doing low-impact exercise, such as: ? Swimming. ? Water aerobics. ? Biking. ? Walking. Managing pain, stiffness, and swelling      If directed, put ice on the joint. ? Put ice in a plastic bag. ? Place a towel between your skin and the bag. ? Leave the ice on for 20 minutes, 2-3 times per day.  If your joint is swollen, raise (elevate) it above the level of your heart if directed by your health care provider.  If your joint feels stiff in the morning, try taking a warm shower.  If directed, apply heat to the affected area as often as told by your health care provider. Use the heat source that your health care provider recommends, such as a moist heat pack or a heating pad. If you have diabetes, do not apply heat without permission from your health care provider. To apply heat: ? Place a towel between your skin and the heat source. ? Leave the heat on for 20-30 minutes. ? Remove the heat if your skin turns bright red. This is especially important if you are unable to feel pain, heat, or cold. You may have a greater risk of getting burned. General instructions  Do not  use any products that contain nicotine or tobacco, such as cigarettes, e-cigarettes, and chewing tobacco. If you need help quitting, ask your health care provider.  Keep all follow-up visits as told by your health care provider. This is important. Contact a health care provider if:  The pain gets worse.  You have a fever. Get help right away if:  You develop severe joint pain, swelling, or redness.  Many joints become painful and swollen.  You develop severe back pain.  You develop severe weakness in your leg.  You cannot control  your bladder or bowels. Summary  Arthritis is a term that is commonly used to refer to joint pain or joint disease. There are more than 100 types of arthritis.  The most common cause of this condition is wear and tear of a joint. Other causes include gout, inflammation or infection of the joint, sprains, or allergies.  Symptoms of this condition include redness, swelling, or stiffness of the joint. Other symptoms include warmth, fever, or feeling ill.  This condition is treated with rest, elevation, medicines, and applying cold or hot packs.  Follow your health care provider's instructions about medicines, activity, exercises, and other home care treatments. This information is not intended to replace advice given to you by your health care provider. Make sure you discuss any questions you have with your health care provider. Document Revised: 09/15/2018 Document Reviewed: 09/15/2018 Elsevier Patient Education  2020 [employer].

## 2020-02-20 ENCOUNTER — Other Ambulatory Visit: Payer: Self-pay | Admitting: Internal Medicine

## 2020-02-20 DIAGNOSIS — F4323 Adjustment disorder with mixed anxiety and depressed mood: Secondary | ICD-10-CM

## 2020-02-22 LAB — COMPLETE METABOLIC PANEL WITH GFR
AG Ratio: 1.9 (calc) (ref 1.0–2.5)
ALT: 20 U/L (ref 6–29)
AST: 24 U/L (ref 10–35)
Albumin: 4.6 g/dL (ref 3.6–5.1)
Alkaline phosphatase (APISO): 62 U/L (ref 37–153)
BUN: 13 mg/dL (ref 7–25)
CO2: 28 mmol/L (ref 20–32)
Calcium: 9.6 mg/dL (ref 8.6–10.4)
Chloride: 102 mmol/L (ref 98–110)
Creat: 0.64 mg/dL (ref 0.50–1.05)
GFR, Est African American: 119 mL/min/{1.73_m2} (ref >=60)
GFR, Est Non African American: 103 mL/min/{1.73_m2} (ref >=60)
Globulin: 2.4 g/dL (calc) (ref 1.9–3.7)
Glucose, Bld: 84 mg/dL (ref 65–99)
Potassium: 4.5 mmol/L (ref 3.5–5.3)
Sodium: 139 mmol/L (ref 135–146)
Total Bilirubin: 0.3 mg/dL (ref 0.2–1.2)
Total Protein: 7 g/dL (ref 6.1–8.1)

## 2020-02-22 LAB — ANA: Anti Nuclear Antibody (ANA): POSITIVE — AB

## 2020-02-22 LAB — CBC WITH DIFFERENTIAL/PLATELET
Absolute Monocytes: 618 cells/uL (ref 200–950)
Basophils Absolute: 61 cells/uL (ref 0–200)
Basophils Relative: 0.7 %
Eosinophils Absolute: 235 cells/uL (ref 15–500)
Eosinophils Relative: 2.7 %
HCT: 39.9 % (ref 35.0–45.0)
Hemoglobin: 13.2 g/dL (ref 11.7–15.5)
Lymphs Abs: 2828 cells/uL (ref 850–3900)
MCH: 30.3 pg (ref 27.0–33.0)
MCHC: 33.1 g/dL (ref 32.0–36.0)
MCV: 91.7 fL (ref 80.0–100.0)
MPV: 10.8 fL (ref 7.5–12.5)
Monocytes Relative: 7.1 %
Neutro Abs: 4959 cells/uL (ref 1500–7800)
Neutrophils Relative %: 57 %
Platelets: 311 10*3/uL (ref 140–400)
RBC: 4.35 10*6/uL (ref 3.80–5.10)
RDW: 12.5 % (ref 11.0–15.0)
Total Lymphocyte: 32.5 %
WBC: 8.7 10*3/uL (ref 3.8–10.8)

## 2020-02-22 LAB — ANTI-NUCLEAR AB-TITER (ANA TITER): ANA Titer 1: 1:80 {titer} — ABNORMAL HIGH

## 2020-02-22 LAB — C-REACTIVE PROTEIN: CRP: 2.8 mg/L (ref <8.0)

## 2020-02-22 LAB — LIPID PANEL
Cholesterol: 203 mg/dL — ABNORMAL HIGH (ref <200)
HDL: 85 mg/dL (ref >=50)
LDL Cholesterol (Calc): 102 mg/dL (calc) — ABNORMAL HIGH
Non-HDL Cholesterol (Calc): 118 mg/dL (calc) (ref <130)
Total CHOL/HDL Ratio: 2.4 (calc) (ref <5.0)
Triglycerides: 73 mg/dL (ref <150)

## 2020-02-22 LAB — RHEUMATOID FACTOR: Rheumatoid fact SerPl-aCnc: <14 IU/mL (ref <14)

## 2020-02-22 LAB — ANTI-SMITH ANTIBODY: ENA SM Ab Ser-aCnc: <1 AI

## 2020-02-22 LAB — SEDIMENTATION RATE: Sed Rate: 2 mm/h (ref 0–30)

## 2020-02-22 LAB — CYCLIC CITRUL PEPTIDE ANTIBODY, IGG: Cyclic Citrullin Peptide Ab: <16 UNITS

## 2020-02-22 LAB — ANTI-DNA ANTIBODY, DOUBLE-STRANDED: ds DNA Ab: <1 IU/mL

## 2020-08-31 NOTE — Progress Notes (Signed)
Complete Physical  Assessment and Plan:  Tricia Berger was seen today for annual exam.  Diagnoses and all orders for this visit:  Encounter for routine adult health examination with abnormal findings Continue annual GYN follow up, regular derm, vision, dental screenings Flu vaccine administered today  Lifestyle discussed  Insulin resistance, Hx -     Continue healthy diet/exercise, weight maintenance -     Hemoglobin A1c  Vitamin D deficiency -     VITAMIN D 25 Hydroxy (Vit-D Deficiency, Fractures)  Medication management -     CBC with Differential/Platelet -     CMP/GFR -     Magnesium  Screening for cardiovascular condition/ family history of cardiovascular condition  -     EKG 12-Lead  Screening for colon cancer -     Referral to GI for colonoscopy placed   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  Check lipid panel.  -     TSH -     Lipid panel  Need for influenza vaccine Quadrivalent flu vaccine administered without complication today    Orders Placed This Encounter  Procedures  . CBC with Differential/Platelet  . COMPLETE METABOLIC PANEL WITH GFR  . Magnesium  . Lipid panel  . TSH  . VITAMIN D 25 Hydroxy (Vit-D Deficiency, Fractures)  . Urinalysis, Routine w reflex microscopic  . Insulin, random  . Vitamin B12  . Ambulatory referral to Gastroenterology     Discussed med's effects and SE's. Screening labs and tests as requested with regular follow-up as recommended. Over 40 minutes of exam, counseling, chart review, and complex, high level critical decision making was performed this visit.   Future Appointments  Date Time Provider [place]  09/04/2021  3:00 PM Liane Comber, NP [facility] None    HPI  54 y.o. very healthy married  female, presents for a complete physical and follow up for has Insulin resistance, Hx; Vitamin D deficiency; Medication management; Environmental allergies; BMI 25.0-25.9,adult; Hyperlipidemia; and Polyarthralgia on their problem list.   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   She is followed annually by GYN Dr. Ouida Sills at [facility] and performs monthly breast checks. She was having hot flashes but recently improved and stopped estroven.   She was referred to urology/nephrology for concerns of persistent hemoglobinuria,  She had serum workup and kidney ultrasound by Dr. Hollie Salk in 04/2018 with benign workup, urology did cysto which was negative. Has since spontaneously resolved, monitoring annually at this office.   She reported polyarthralgia, symmetrical, bil knees, hips, hands, in the last 1-2 years with AM stiffness, some fatigue, with ? History of RA in MGM, recalls significant deviation deformities in hands, had sed, CRP, RF, anti-DNA, anti CCP, anti smith; had ANA + 1:80 with reassuring Dense fine speckled pattern. Exam essentially normal. She has been taking tumeric + black pepper.   BMI is Body mass index is 25.74 kg/m., she has been working on diet and exercise, did weight watchers after weight getting up to 139 lb, just maintaining in the last year, goal is 117 lb. Still working from home, walking at least 30 min 3 days a week.  Admits diet could be better She drinks 3 bottles of water, also sugar free beverages/flavored water Coffee - 16 oz/day  Sleep: good Wt Readings from Last 3 Encounters:  09/01/20 129 lb 9.6 oz (58.8 kg)  02/18/20 130 lb (59 kg)  09/02/19 126 lb 12.8 oz (57.5 kg)   Her blood pressure today is BP: 130/80 She does workout. She denies chest pain, shortness of breath, dizziness.   She is not on cholesterol medication and denies myalgias. Her cholesterol is not at goal. The cholesterol last  visit was:   Lab Results  Component Value Date   CHOL 203 (H) 02/18/2020   HDL 85 02/18/2020   LDLCALC 102 (H) 02/18/2020   TRIG 73 02/18/2020   CHOLHDL 2.4 02/18/2020   She has been working on diet and exercise for history of insulin resistance. Last A1C in the office was:  Lab Results  Component Value Date   HGBA1C 5.1 09/02/2019   Last GFR: Lab Results  Component Value Date   GFRNONAA 103 02/18/2020   Patient is on Vitamin D supplement (2000 IU daily) and at goal:   Lab Results  Component Value Date   VD25OH 54 09/02/2019     She is taking B complex vitamin supplement  Lab Results  Component Value Date   VITAMINB12 365 08/27/2017      Current Medications:  Current Outpatient Medications on File Prior to Visit  Medication Sig Dispense Refill  . b complex vitamins tablet Take 1 tablet by mouth daily.    . cetirizine (ZYRTEC) 10 MG tablet Take 10 mg by mouth daily. OTC    . Cholecalciferol (VITAMIN D PO) Take 2,000 Int'l Units by mouth daily.     . TURMERIC PO Take 1 tablet by mouth daily.    . vitamin C (ASCORBIC ACID) 500 MG tablet Take 500 mg by mouth daily.     No current facility-administered medications on file prior to visit.   Allergies:  No Known Allergies Medical History:  She has Insulin resistance, Hx; Vitamin D deficiency; Medication management; Environmental allergies; BMI 25.0-25.9,adult; Hyperlipidemia; and Polyarthralgia on their problem list. Health Maintenance:   Immunization History  Administered Date(s) Administered  . Influenza Inj Mdck Quad With Preservative 06/23/2019  . Influenza-Unspecified 08/01/2016, 08/07/2017, 08/06/2018  . PPD Test 07/05/2014, 07/07/2015, 08/06/2016  . Pneumococcal Polysaccharide-23 06/01/2010  . Td 10/22/2004  . Tdap 07/07/2015    Tetanus: 2016 Flu vaccine: 2020 TODAY Shingrix: will check with insurance about shingrix  Covid 19: 2/2, 2021, pfizer  LMP: Patient's last menstrual period was  08/20/2017. Pelvic/Pap: 06/2020 - by GYN Dr. Ouida Sills MGM: 06/2020 by GYN DEXA: n/a  Colonoscopy: DUE - was referred to GI last year but never scheduled, new referral placed today after discussion  EGD: n/a  Last Dental Exam: Dr. Lavonne Chick, 2021, q 6 months Last Eye Exam: Dr. Sherral Hammers, 08/2020, wears contacts, no issues, goes annually Last Derm Exam: Dr. Chana Bode, goes q12 months, no concerning lesions  Patient Care Team: Unk Pinto, MD as PCP - General (Internal Medicine) Deirdre Pippins, PA-C as Physician Assistant (Internal Medicine) Festus Aloe, MD as Consulting Physician (Urology)  Surgical History:  She has a past surgical history that includes Anterior cruciate ligament repair (Left, 02/2017) and Wisdom tooth extraction. Family History:  Herfamily history includes Breast cancer (age of onset: 21) in her maternal aunt; Breast cancer (age of onset: 7) in her maternal grandmother; Diabetes type II in her maternal grandfather; Heart disease in her maternal grandfather and maternal grandmother; Hypertension in her mother. Social History:  She reports that she has never smoked. She has never used smokeless  tobacco. She reports that she does not drink alcohol and does not use drugs.   Review of Systems: Review of Systems  Constitutional: Negative for malaise/fatigue and weight loss.  HENT: Negative for hearing loss and tinnitus.   Eyes: Negative for blurred vision and double vision.  Respiratory: Negative for cough, sputum production, shortness of breath and wheezing.   Cardiovascular: Negative for chest pain, palpitations, orthopnea, claudication and leg swelling.  Gastrointestinal: Negative for abdominal pain, blood in stool, constipation, diarrhea, heartburn, melena, nausea and vomiting.  Genitourinary: Negative.   Musculoskeletal: Positive for joint pain. Negative for myalgias.  Skin: Negative for rash.  Neurological: Negative for dizziness, tingling, sensory  change, weakness and headaches.  Endo/Heme/Allergies: Negative for polydipsia.  Psychiatric/Behavioral: Negative for depression, substance abuse and suicidal ideas. The patient is nervous/anxious and has insomnia.   All other systems reviewed and are negative.   Physical Exam: Estimated body mass index is 25.74 kg/m as calculated from the following:   Height as of this encounter: 4' 11.5" (1.511 m).   Weight as of this encounter: 129 lb 9.6 oz (58.8 kg). BP 130/80   Pulse 91   Temp (!) 97.5 F (36.4 C)   Ht 4' 11.5" (1.511 m)   Wt 129 lb 9.6 oz (58.8 kg)   LMP 08/20/2017   SpO2 97%   BMI 25.74 kg/m  General Appearance: Well nourished, in no apparent distress.  Eyes: PERRLA, EOMs, conjunctiva no swelling or erythema  Sinuses: No Frontal/maxillary tenderness  ENT/Mouth: Ext aud canals clear, normal light reflex with TMs without erythema, bulging. Good dentition. No erythema, swelling, or exudate on post pharynx. Tonsils not swollen or erythematous. Hearing normal.  Neck: Supple, thyroid normal. No bruits  Respiratory: Respiratory effort normal, BS equal bilaterally without rales, rhonchi, wheezing or stridor.  Cardio: RRR without murmurs, rubs or gallops. Brisk peripheral pulses without edema.  Chest: symmetric, with normal excursions and percussion.  Breasts: defer to GYN Abdomen: Soft, nontender, no guarding, rebound, hernias, masses, or organomegaly.  Lymphatics: Non tender without lymphadenopathy.  Genitourinary: defer to GYN Musculoskeletal: Full ROM all peripheral extremities,5/5 strength, and normal gait. No obvious deformity, no effusions, heat, laxity.  Skin: Warm, dry without rashes, lesions, ecchymosis. Neuro: Cranial nerves intact, reflexes equal bilaterally. Normal muscle tone, no cerebellar symptoms. Sensation intact.  Psych: Awake and oriented X 3, normal affect, Insight and Judgment appropriate.   EKG: WNL, RSR' V1 in 2020, defer this year  Izora Ribas 3:42  PM [facility]

## 2020-09-01 ENCOUNTER — Encounter: Payer: Self-pay | Admitting: Adult Health

## 2020-09-01 ENCOUNTER — Other Ambulatory Visit: Payer: Self-pay

## 2020-09-01 ENCOUNTER — Ambulatory Visit: Payer: BC Managed Care – PPO | Admitting: Adult Health

## 2020-09-01 VITALS — BP 130/80 | HR 91 | Temp 97.5°F | Ht 59.5 in | Wt 129.6 lb

## 2020-09-01 DIAGNOSIS — Z131 Encounter for screening for diabetes mellitus: Secondary | ICD-10-CM | POA: Diagnosis not present

## 2020-09-01 DIAGNOSIS — Z23 Encounter for immunization: Secondary | ICD-10-CM | POA: Diagnosis not present

## 2020-09-01 DIAGNOSIS — Z0000 Encounter for general adult medical examination without abnormal findings: Secondary | ICD-10-CM | POA: Diagnosis not present

## 2020-09-01 DIAGNOSIS — Z130 Encounter for screening for diseases of the blood and blood-forming organs and certain disorders involving the immune mechanism: Secondary | ICD-10-CM

## 2020-09-01 DIAGNOSIS — Z1389 Encounter for screening for other disorder: Secondary | ICD-10-CM | POA: Diagnosis not present

## 2020-09-01 DIAGNOSIS — Z1329 Encounter for screening for other suspected endocrine disorder: Secondary | ICD-10-CM

## 2020-09-01 DIAGNOSIS — Z13220 Encounter for screening for lipoid disorders: Secondary | ICD-10-CM

## 2020-09-01 DIAGNOSIS — Z79899 Other long term (current) drug therapy: Secondary | ICD-10-CM

## 2020-09-01 DIAGNOSIS — E782 Mixed hyperlipidemia: Secondary | ICD-10-CM

## 2020-09-01 DIAGNOSIS — D649 Anemia, unspecified: Secondary | ICD-10-CM

## 2020-09-01 DIAGNOSIS — E538 Deficiency of other specified B group vitamins: Secondary | ICD-10-CM

## 2020-09-01 DIAGNOSIS — Z6825 Body mass index (BMI) 25.0-25.9, adult: Secondary | ICD-10-CM

## 2020-09-01 DIAGNOSIS — Z0001 Encounter for general adult medical examination with abnormal findings: Secondary | ICD-10-CM

## 2020-09-01 DIAGNOSIS — Z6824 Body mass index (BMI) 24.0-24.9, adult: Secondary | ICD-10-CM

## 2020-09-01 DIAGNOSIS — F4323 Adjustment disorder with mixed anxiety and depressed mood: Secondary | ICD-10-CM

## 2020-09-01 DIAGNOSIS — E8881 Metabolic syndrome: Secondary | ICD-10-CM

## 2020-09-01 DIAGNOSIS — Z1211 Encounter for screening for malignant neoplasm of colon: Secondary | ICD-10-CM

## 2020-09-01 DIAGNOSIS — M2550 Pain in unspecified joint: Secondary | ICD-10-CM

## 2020-09-01 DIAGNOSIS — Z9109 Other allergy status, other than to drugs and biological substances: Secondary | ICD-10-CM

## 2020-09-01 DIAGNOSIS — E559 Vitamin D deficiency, unspecified: Secondary | ICD-10-CM | POA: Diagnosis not present

## 2020-09-01 NOTE — Patient Instructions (Addendum)
  Ms. Suen , Thank you for taking time to come for your Annual Wellness Visit. I appreciate your ongoing commitment to your health goals. Please review the following plan we discussed and let me know if I can assist you in the future.   These are the goals we discussed: Goals    . DIET - EAT MORE FRUITS AND VEGETABLES     Aim for 5-7+ servings daily     . Exercise 150 min/wk Moderate Activity     Aim for 2-3 times of weights/ strength training       This is a list of the screening recommended for you and due dates:  Health Maintenance  Topic Date Due  . COVID-19 Vaccine (1) Never done  . Colon Cancer Screening  Never done  . Mammogram  10/23/2019  . Flu Shot  05/22/2020  . Pap Smear  10/22/2020  . Tetanus Vaccine  07/06/2025  . HIV Screening  Completed  .  Hepatitis C: One time screening is recommended by Center for Disease Control  (CDC) for  adults born from [birth year] through 1963.   Discontinued    Tart cherry supplement for arthralgias    Checkin with insurance about shingrix vaccine - can get at [facility] or [facility]   Please send covid 19 vaccine card via mychart      Know what a healthy weight is for you (roughly BMI <25) and aim to maintain this  Aim for 7+ servings of fruits and vegetables daily  65-80+ fluid ounces of water or unsweet tea for healthy kidneys  Limit to max 1 drink of alcohol per day; avoid smoking/tobacco  Limit animal fats in diet for cholesterol and heart health - choose grass fed whenever available  Avoid highly processed foods, and foods high in saturated/trans fats  Aim for low stress - take time to unwind and care for your mental health  Aim for 150 min of moderate intensity exercise weekly for heart health, and weights twice weekly for bone health  Aim for 7-9 hours of sleep daily      A great goal to work towards is aiming to get in a serving daily of some of the most nutritionally dense foods - G- BOMBS daily

## 2020-09-02 LAB — CBC WITH DIFFERENTIAL/PLATELET
Absolute Monocytes: 653 cells/uL (ref 200–950)
Basophils Absolute: 61 cells/uL (ref 0–200)
Basophils Relative: 0.6 %
Eosinophils Absolute: 214 cells/uL (ref 15–500)
Eosinophils Relative: 2.1 %
HCT: 38.6 % (ref 35.0–45.0)
Hemoglobin: 13.2 g/dL (ref 11.7–15.5)
Lymphs Abs: 2876 cells/uL (ref 850–3900)
MCH: 30.5 pg (ref 27.0–33.0)
MCHC: 34.2 g/dL (ref 32.0–36.0)
MCV: 89.1 fL (ref 80.0–100.0)
MPV: 10.6 fL (ref 7.5–12.5)
Monocytes Relative: 6.4 %
Neutro Abs: 6395 cells/uL (ref 1500–7800)
Neutrophils Relative %: 62.7 %
Platelets: 347 10*3/uL (ref 140–400)
RBC: 4.33 10*6/uL (ref 3.80–5.10)
RDW: 12.1 % (ref 11.0–15.0)
Total Lymphocyte: 28.2 %
WBC: 10.2 10*3/uL (ref 3.8–10.8)

## 2020-09-02 LAB — URINALYSIS, ROUTINE W REFLEX MICROSCOPIC
Bilirubin Urine: NEGATIVE
Glucose, UA: NEGATIVE
Hgb urine dipstick: NEGATIVE
Ketones, ur: NEGATIVE
Leukocytes,Ua: NEGATIVE
Nitrite: NEGATIVE
Protein, ur: NEGATIVE
Specific Gravity, Urine: 1.008 (ref 1.001–1.03)
pH: 7.5 (ref 5.0–8.0)

## 2020-09-02 LAB — VITAMIN D 25 HYDROXY (VIT D DEFICIENCY, FRACTURES): Vit D, 25-Hydroxy: 47 ng/mL (ref 30–100)

## 2020-09-02 LAB — COMPLETE METABOLIC PANEL WITH GFR
AG Ratio: 1.7 (calc) (ref 1.0–2.5)
ALT: 17 U/L (ref 6–29)
AST: 18 U/L (ref 10–35)
Albumin: 4.5 g/dL (ref 3.6–5.1)
Alkaline phosphatase (APISO): 67 U/L (ref 37–153)
BUN: 16 mg/dL (ref 7–25)
CO2: 28 mmol/L (ref 20–32)
Calcium: 9.5 mg/dL (ref 8.6–10.4)
Chloride: 103 mmol/L (ref 98–110)
Creat: 0.73 mg/dL (ref 0.50–1.05)
GFR, Est African American: 110 mL/min/{1.73_m2} (ref >=60)
GFR, Est Non African American: 95 mL/min/{1.73_m2} (ref >=60)
Globulin: 2.6 g/dL (calc) (ref 1.9–3.7)
Glucose, Bld: 92 mg/dL (ref 65–99)
Potassium: 3.8 mmol/L (ref 3.5–5.3)
Sodium: 140 mmol/L (ref 135–146)
Total Bilirubin: 0.3 mg/dL (ref 0.2–1.2)
Total Protein: 7.1 g/dL (ref 6.1–8.1)

## 2020-09-02 LAB — MAGNESIUM: Magnesium: 2.1 mg/dL (ref 1.5–2.5)

## 2020-09-02 LAB — LIPID PANEL
Cholesterol: 194 mg/dL (ref <200)
HDL: 82 mg/dL (ref >=50)
LDL Cholesterol (Calc): 93 mg/dL (calc)
Non-HDL Cholesterol (Calc): 112 mg/dL (calc) (ref <130)
Total CHOL/HDL Ratio: 2.4 (calc) (ref <5.0)
Triglycerides: 93 mg/dL (ref <150)

## 2020-09-02 LAB — INSULIN, RANDOM: Insulin: 17.8 u[IU]/mL

## 2020-09-02 LAB — VITAMIN B12: Vitamin B-12: >2000 pg/mL — ABNORMAL HIGH (ref 200–1100)

## 2020-09-02 LAB — TSH: TSH: 1.23 mIU/L

## 2020-09-02 NOTE — Addendum Note (Signed)
Addended by: Chancy Hurter on: 09/02/2020 08:48 AM   Modules accepted: Orders

## 2021-01-09 NOTE — Progress Notes (Signed)
Mailed letter to patient, reminding to schedule colonoscopy.

## 2021-02-15 ENCOUNTER — Encounter: Payer: Self-pay | Admitting: Gastroenterology

## 2021-04-14 ENCOUNTER — Other Ambulatory Visit: Payer: Self-pay

## 2021-04-14 ENCOUNTER — Ambulatory Visit (AMBULATORY_SURGERY_CENTER): Payer: 59

## 2021-04-14 VITALS — Ht 59.5 in | Wt 131.0 lb

## 2021-04-14 DIAGNOSIS — Z1211 Encounter for screening for malignant neoplasm of colon: Secondary | ICD-10-CM

## 2021-04-14 MED ORDER — PEG-KCL-NACL-NASULF-NA ASC-C 100 G PO SOLR: 1.0000 | Freq: Once | ORAL | 0 refills | Status: AC

## 2021-04-14 NOTE — Progress Notes (Signed)
No egg or soy allergy known to patient  No issues with past sedation with any surgeries or procedures Patient denies ever being told they had issues or difficulty with intubation  No FH of Malignant Hyperthermia No diet pills per patient No home 02 use per patient  No blood thinners per patient  Pt denies issues with constipation at this time;  No A fib or A flutter  EMMI video via [place] 19 guidelines implemented in PV today with Pt and RN  Pt is fully vaccinated for Covid   NO PA's for preps discussed with pt in PV today  Discussed with pt there will be an out-of-pocket cost for prep and that varies from $0 to 70 dollars   Due to the COVID-19 pandemic we are asking patients to follow certain guidelines.  Pt aware of COVID protocols and [facility] guidelines

## 2021-04-28 ENCOUNTER — Encounter: Payer: Self-pay | Admitting: Gastroenterology

## 2021-04-28 ENCOUNTER — Ambulatory Visit (AMBULATORY_SURGERY_CENTER): Payer: 59 | Admitting: Gastroenterology

## 2021-04-28 ENCOUNTER — Other Ambulatory Visit: Payer: Self-pay

## 2021-04-28 VITALS — BP 115/54 | HR 75 | Temp 98.0°F | Resp 19 | Ht 59.0 in | Wt 131.0 lb

## 2021-04-28 DIAGNOSIS — D122 Benign neoplasm of ascending colon: Secondary | ICD-10-CM | POA: Diagnosis not present

## 2021-04-28 DIAGNOSIS — K635 Polyp of colon: Secondary | ICD-10-CM

## 2021-04-28 DIAGNOSIS — D123 Benign neoplasm of transverse colon: Secondary | ICD-10-CM

## 2021-04-28 DIAGNOSIS — Z1211 Encounter for screening for malignant neoplasm of colon: Secondary | ICD-10-CM | POA: Diagnosis not present

## 2021-04-28 DIAGNOSIS — D125 Benign neoplasm of sigmoid colon: Secondary | ICD-10-CM

## 2021-04-28 HISTORY — PX: COLONOSCOPY: SHX174

## 2021-04-28 MED ORDER — SODIUM CHLORIDE 0.9 % IV SOLN: 500.0000 mL | Freq: Once | INTRAVENOUS | Status: DC

## 2021-04-28 NOTE — Progress Notes (Signed)
Medical history reviewed with no changes noted. VS assessed by C.W 

## 2021-04-28 NOTE — Patient Instructions (Signed)
Handouts given :  polyps  Resume previous diet Continue current medications Await pathology results  YOU HAD AN ENDOSCOPIC PROCEDURE TODAY AT [facility]:   Refer to the procedure report that was given to you for any specific questions about what was found during the examination.  If the procedure report does not answer your questions, please call your gastroenterologist to clarify.  If you requested that your care partner not be given the details of your procedure findings, then the procedure report has been included in a sealed envelope for you to review at your convenience later.  YOU SHOULD EXPECT: Some feelings of bloating in the abdomen. Passage of more gas than usual.  Walking can help get rid of the air that was put into your GI tract during the procedure and reduce the bloating. If you had a lower endoscopy (such as a colonoscopy or flexible sigmoidoscopy) you may notice spotting of blood in your stool or on the toilet paper. If you underwent a bowel prep for your procedure, you may not have a normal bowel movement for a few days.  Please Note:  You might notice some irritation and congestion in your nose or some drainage.  This is from the oxygen used during your procedure.  There is no need for concern and it should clear up in a day or so.  SYMPTOMS TO REPORT IMMEDIATELY:  Following lower endoscopy (colonoscopy or flexible sigmoidoscopy):  Excessive amounts of blood in the stool  Significant tenderness or worsening of abdominal pains  Swelling of the abdomen that is new, acute  Fever of 100F or higher  For urgent or emergent issues, a gastroenterologist can be reached at any hour by calling [phone]. Do not use MyChart messaging for urgent concerns.   DIET:  We do recommend a small meal at first, but then you may proceed to your regular diet.  Drink plenty of fluids but you should avoid alcoholic beverages for 24 hours.  ACTIVITY:  You should plan to take  it easy for the rest of today and you should NOT DRIVE or use heavy machinery until tomorrow (because of the sedation medicines used during the test).    FOLLOW UP: Our staff will call the number listed on your records 48-72 hours following your procedure to check on you and address any questions or concerns that you may have regarding the information given to you following your procedure. If we do not reach you, we will leave a message.  We will attempt to reach you two times.  During this call, we will ask if you have developed any symptoms of COVID 19. If you develop any symptoms (ie: fever, flu-like symptoms, shortness of breath, cough etc.) before then, please call [phone].  If you test positive for Covid 19 in the 2 weeks post procedure, please call and report this information to [place].    If any biopsies were taken you will be contacted by phone or by letter within the next 1-3 weeks.  Please call us at [phone] if you have not heard about the biopsies in 3 weeks.   SIGNATURES/CONFIDENTIALITY: You and/or your care partner have signed paperwork which will be entered into your electronic medical record.  These signatures attest to the fact that that the information above on your After Visit Summary has been reviewed and is understood.  Full responsibility of the confidentiality of this discharge information lies with you and/or your care-partner.

## 2021-04-28 NOTE — Progress Notes (Signed)
A/ox3, pleased with MAC, report to RN 

## 2021-04-28 NOTE — Op Note (Signed)
[facility] Patient Name: Tricia Berger Procedure Date: 04/28/2021 11:41 AM MRN: [id number] Endoscopist: Remo Lipps P. Havery Moros , MD Age: 55 Referring MD:  Date of Birth: [birth year] Gender: Female Account #: [id number] Procedure:                Colonoscopy Indications:              Screening for colorectal malignant neoplasm, This                            is the patient's first colonoscopy Medicines:                Monitored Anesthesia Care Procedure:                Pre-Anesthesia Assessment:                           - Prior to the procedure, a History and Physical                            was performed, and patient medications and                            allergies were reviewed. The patient's tolerance of                            previous anesthesia was also reviewed. The risks                            and benefits of the procedure and the sedation                            options and risks were discussed with the patient.                            All questions were answered, and informed consent                            was obtained. Prior Anticoagulants: The patient has                            taken no previous anticoagulant or antiplatelet                            agents. ASA Grade Assessment: I - A normal, healthy                            patient. After reviewing the risks and benefits,                            the patient was deemed in satisfactory condition to                            undergo the procedure.  After obtaining informed consent, the colonoscope                            was passed under direct vision. Throughout the                            procedure, the patient's blood pressure, pulse, and                            oxygen saturations were monitored continuously. The                            Olympus PCF-H190DL [id number]) Colonoscope was                            introduced through the anus and  advanced to the the                            cecum, identified by appendiceal orifice and                            ileocecal valve. The colonoscopy was performed                            without difficulty. The patient tolerated the                            procedure well. The quality of the bowel                            preparation was adequate. The ileocecal valve,                            appendiceal orifice, and rectum were photographed. Scope In: 11:54:13 AM Scope Out: 12:41:35 PM Scope Withdrawal Time: 0 hours 39 minutes 56 seconds  Total Procedure Duration: 0 hours 47 minutes 22 seconds  Findings:                 The perianal and digital rectal examinations were                            normal.                           A 2 mm polyp was found in the ascending colon. The                            polyp was sessile. The polyp was removed with a                            cold snare. Resection and retrieval were complete.                           A roughly 25 mm polyp was found in the ascending  colon. The polyp was sessile, long and thin. The                            polyp was removed with a piecemeal technique using                            a cold snare. Resection and retrieval were                            complete. Area across the lumen laterally was                            tattooed with an injection of Spot (carbon black).                           A 20 mm polyp was found in the hepatic flexure. The                            polyp was flat. Was difficult to grasp using                            regular cold snare, thus Captivator snare was used                            which grasped it much better. The polyp was removed                            with a piecemeal technique using a cold snare.                            Resection and retrieval were complete. Area distal                            to this polyp was tattooed  with an injection of                            Spot (carbon black).                           A 8 to 10 mm polyp was found in the sigmoid colon.                            The polyp was sessile. The polyp was removed with a                            cold snare. Resection and retrieval were complete.                           The colon was tortuous, cecal intubation was                            challenging.  Internal hemorrhoids were found during retroflexion.                           The exam was otherwise without abnormality. Complications:            No immediate complications. Estimated blood loss:                            Minimal. Estimated Blood Loss:     Estimated blood loss was minimal. Impression:               - One 2 mm polyp in the ascending colon, removed                            with a cold snare. Resected and retrieved.                           - One 25 mm polyp in the ascending colon, removed                            piecemeal using a cold snare. Resected and                            retrieved. Tattooed.                           - One 20 mm polyp at the hepatic flexure, removed                            piecemeal using a cold snare. Resected and                            retrieved. Tattooed.                           - One 8 to 10 mm polyp in the sigmoid colon,                            removed with a cold snare. Resected and retrieved.                           - Tortuous colon.                           - Internal hemorrhoids.                           - The examination was otherwise normal. Recommendation:           - Patient has a contact number available for                            emergencies. The signs and symptoms of potential                            delayed complications were discussed with  the                            patient. Return to normal activities tomorrow.                            Written discharge  instructions were provided to the                            patient.                           - Resume previous diet.                           - Continue present medications.                           - Await pathology results.                           - Anticipate repeat colonoscopy in 6 months for                            surveillance after piecemeal polypectomies. Remo Lipps P. [place] Jr, MD 04/28/2021 12:49:03 PM This report has been signed electronically.

## 2021-04-28 NOTE — Progress Notes (Signed)
Called to room to assist during endoscopic procedure.  Patient ID and intended procedure confirmed with present staff. Received instructions for my participation in the procedure from the performing physician.  

## 2021-05-02 ENCOUNTER — Telehealth: Payer: Self-pay | Admitting: *Deleted

## 2021-05-02 NOTE — Telephone Encounter (Signed)
  Follow up Call-  Call back number 04/28/2021  Post procedure Call Back phone  # [phone]  Permission to leave phone message Yes  Some recent data might be hidden     Patient questions:  Do you have a fever, pain , or abdominal swelling? No. Pain Score  0 *  Have you tolerated food without any problems? Yes.    Have you been able to return to your normal activities? Yes.    Do you have any questions about your discharge instructions: Diet   No. Medications  No. Follow up visit  No.  Do you have questions or concerns about your Care? No.  Actions: * If pain score is 4 or above: No action needed, pain <4.  Have you developed a fever since your procedure? no  2.   Have you had an respiratory symptoms (SOB or cough) since your procedure? no  3.   Have you tested positive for COVID 19 since your procedure  no 4.   Have you had any family members/close contacts diagnosed with the COVID 19 since your procedure?  no   If yes to any of these questions please route to Joylene John, RN and Joella Prince, RN

## 2021-08-15 DIAGNOSIS — R87610 Atypical squamous cells of undetermined significance on cytologic smear of cervix (ASC-US): Secondary | ICD-10-CM | POA: Diagnosis not present

## 2021-08-15 DIAGNOSIS — Z1231 Encounter for screening mammogram for malignant neoplasm of breast: Secondary | ICD-10-CM | POA: Diagnosis not present

## 2021-08-15 DIAGNOSIS — Z01419 Encounter for gynecological examination (general) (routine) without abnormal findings: Secondary | ICD-10-CM | POA: Diagnosis not present

## 2021-08-15 DIAGNOSIS — Z124 Encounter for screening for malignant neoplasm of cervix: Secondary | ICD-10-CM | POA: Diagnosis not present

## 2021-08-30 NOTE — Progress Notes (Signed)
Complete Physical  Assessment and Plan:  Tricia Berger was seen today for annual exam.  Diagnoses and all orders for this visit:  Encounter for routine adult health examination with abnormal findings Continue annual GYN follow up, regular derm, vision, dental screenings Flu vaccine administered today  Lifestyle discussed  Insulin resistance, Hx -     Continue healthy diet/exercise, weight maintenance -     Hemoglobin A1c  Vitamin D deficiency -     VITAMIN D 25 Hydroxy (Vit-D Deficiency, Fractures)  Medication management -     CBC with Differential/Platelet -     CMP/GFR -     Magnesium  Overweight BMI 27-27.9 Long discussion about weight loss, diet, and exercise Recommended diet heavy in fruits and veggies and low in animal meats, cheeses, and dairy products, appropriate calorie intake She will try to increase activity  Screening for cardiovascular condition/ family history of cardiovascular condition  -     EKG 12-Lead  Screening for hematuria or proteinuria Has seen nephro/uro; negative cysto; check urine annually  -     routine UA with reflex microscopic Microalbumin/creatinine urine ratio  Hyperlipidemia  Mild, not currently on treatment Continue low cholesterol diet and exercise.  Check lipid panel.  -     TSH -     Lipid panel     Orders Placed This Encounter  Procedures   CBC with Differential/Platelet   COMPLETE METABOLIC PANEL WITH GFR   Magnesium   Lipid panel   Hemoglobin A1c   TSH   VITAMIN D 25 Hydroxy (Vit-D Deficiency, Fractures)   Urinalysis, Routine w reflex microscopic   Microalbumin / creatinine urine ratio   EKG 12-Lead     Discussed med's effects and SE's. Screening labs and tests as requested with regular follow-up as recommended. Over 40 minutes of exam, counseling, chart review, and complex, high level critical decision making was performed this visit.   Future Appointments  Date Time Provider Tricia Berger  09/04/2022  2:00  PM Tricia Bernheim, NP [facility] None    HPI  55 y.o. very healthy married female, presents for a complete physical and follow up for has Insulin resistance, Hx; Vitamin D deficiency; Medication management; Environmental allergies; BMI 25.0-25.9,adult; Hyperlipidemia; and Polyarthralgia on their problem list.   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   She is followed annually by GYN Dr. Ouida Sills at [facility] and performs monthly breast checks. She was having hot flashes but recently improved and stopped estroven.   She was referred to urology/nephrology for concerns of persistent hemoglobinuria,  She had serum workup and kidney ultrasound by Dr. Hollie Salk in 04/2018 with benign workup, urology did cysto which was negative. Has since spontaneously resolved, monitoring annually at this office.   Has been having some low back pain and pain in heels. States pain is worst in the morning and gets better as the day progresses.  Occasional Ibuprofen which relieves slightly. She reported polyarthralgia, symmetrical, bil knees, hips, hands, in the last 1-2 years with AM stiffness, some fatigue, with ? History of RA in MGM, recalls significant deviation deformities in hands, had sed, CRP, RF, anti-DNA, anti CCP, anti smith; had ANA + 1:80 with reassuring Dense fine speckled pattern. Exam essentially normal. She has been taking tumeric + black pepper.   BMI is Body mass index is 27.47 kg/m., she has been working on diet and exercise,  just maintaining in the last year, goal is  117 lb. Still working from home, walking at least 30 min 3 days a week.  Admits diet could be better She drinks 3 bottles of water, also sugar free beverages/flavored water Coffee - 16 oz/day Sleep: good Wt Readings from Last 3 Encounters:  09/04/21 136 lb (61.7 kg)  04/28/21 131 lb (59.4 kg)  04/14/21 131 lb (59.4 kg)   Her blood pressure today is BP:  130/82 She does workout. She denies chest pain, shortness of breath, dizziness.   She is not on cholesterol medication and denies myalgias. Her cholesterol is not at goal. The cholesterol last visit was:   Lab Results  Component Value Date   CHOL 194 09/01/2020   HDL 82 09/01/2020   LDLCALC 93 09/01/2020   TRIG 93 09/01/2020   CHOLHDL 2.4 09/01/2020   She has been working on diet and exercise for history of insulin resistance. Last A1C in the office was:  Lab Results  Component Value Date   HGBA1C 5.1 09/02/2019   Last GFR: Lab Results  Component Value Date   GFRNONAA 95 09/01/2020   Patient is on Vitamin D supplement (2000 IU daily) and at goal:   Lab Results  Component Value Date   VD25OH 47 09/01/2020       Current Medications:  Current Outpatient Medications on File Prior to Visit  Medication Sig Dispense Refill   cetirizine (ZYRTEC) 10 MG tablet Take 10 mg by mouth daily. OTC     Cholecalciferol (VITAMIN D PO) Take 2,000 Int'l Units by mouth daily.      clobetasol (TEMOVATE) 0.05 % external solution Apply topically 2 (two) times daily as needed.     TURMERIC PO Take 1 tablet by mouth daily.     vitamin C (ASCORBIC ACID) 500 MG tablet Take 500 mg by mouth daily.     b complex vitamins tablet Take 1 tablet by mouth daily. (Patient not taking: Reported on 09/04/2021)     No current facility-administered medications on file prior to visit.   Allergies:  No Known Allergies Medical History:  She has Insulin resistance, Hx; Vitamin D deficiency; Medication management; Environmental allergies; BMI 25.0-25.9,adult; Hyperlipidemia; and Polyarthralgia on their problem list. Health Maintenance:   Immunization History  Administered Date(s) Administered   Influenza Inj Mdck Quad With Preservative 06/23/2019, 09/01/2020   Influenza-Unspecified 08/01/2016, 08/07/2017, 08/06/2018, 07/09/2021   PFIZER(Purple Top)SARS-COV-2 Vaccination 01/05/2020, 01/25/2020, 07/09/2021   PPD  Test 07/05/2014, 07/07/2015, 08/06/2016   Pneumococcal Polysaccharide-23 06/01/2010   Td 10/22/2004   Tdap 07/07/2015    Tetanus: 2016 Flu vaccine: 07/09/21 Shingrix: will check with insurance about shingrix  Covid 19: 2/2, 2021, pfizer  LMP: Patient's last menstrual period was 08/20/2017. Pelvic/Pap: 06/2021 - by GYN Dr. Ouida Sills MGM: 06/2021 by GYN DEXA: n/a  Colonoscopy: Dr. Havery Moros -04/28/21 repeat colonoscopy in 6 months for surveillance after piecemeal polypectomies. EGD: n/a  Last Dental Exam: Dr. Lavonne Chick, 2022, q 6 months Last Eye Exam: Dr. Sherral Hammers, 07/2021, wears contacts, no issues, goes annually Last Derm Exam: Dr. Chana Bode, goes q12 months, no concerning lesions  Patient Care Team: Unk Pinto, MD as PCP - General (Internal Medicine) Deirdre Pippins, PA-C as Physician Assistant (Internal Medicine) Festus Aloe, MD as Consulting Physician (Urology)  Surgical History:  She has a past surgical history that includes Anterior cruciate ligament repair (Left, 02/2017); Wisdom tooth extraction; and Arthroscopic repair ACL (Left, 02/2017). Family History:  Herfamily history includes Breast cancer (age of onset: 28) in her maternal aunt; Breast cancer (age of  onset: 66) in her maternal grandmother; Diabetes type II in her maternal grandfather; Heart disease in her maternal grandfather and maternal grandmother; Hypertension in her mother. Social History:  She reports that she has never smoked. She has never used smokeless tobacco. She reports that she does not drink alcohol and does not use drugs.   Review of Systems: Review of Systems  Constitutional:  Negative for chills, fever, malaise/fatigue and weight loss.  HENT:  Negative for congestion, hearing loss, sinus pain, sore throat and tinnitus.   Eyes:  Negative for blurred vision and double vision.  Respiratory:  Negative for cough, hemoptysis, sputum production, shortness of breath and wheezing.    Cardiovascular:  Negative for chest pain, palpitations, orthopnea, claudication and leg swelling.  Gastrointestinal:  Negative for abdominal pain, blood in stool, constipation, diarrhea, heartburn, melena, nausea and vomiting.  Genitourinary: Negative.  Negative for dysuria and urgency.  Musculoskeletal:  Positive for joint pain. Negative for back pain, falls, myalgias and neck pain.  Skin:  Negative for rash.  Neurological:  Negative for dizziness, tingling, tremors, sensory change, weakness and headaches.  Endo/Heme/Allergies:  Negative for polydipsia. Does not bruise/bleed easily.  Psychiatric/Behavioral:  Negative for depression, substance abuse and suicidal ideas. The patient is not nervous/anxious and does not have insomnia.   All other systems reviewed and are negative.  Physical Exam: Estimated body mass index is 27.47 kg/m as calculated from the following:   Height as of this encounter: 4\' 11"  (1.499 m).   Weight as of this encounter: 136 lb (61.7 kg). BP 130/82   Pulse 74   Temp (!) 97.5 F (36.4 C)   Ht 4\' 11"  (1.499 m)   Wt 136 lb (61.7 kg)   LMP 08/20/2017   SpO2 99%   BMI 27.47 kg/m  General Appearance: Well nourished, in no apparent distress.  Eyes: PERRLA, EOMs, conjunctiva no swelling or erythema  Sinuses: No Frontal/maxillary tenderness  ENT/Mouth: Ext aud canals clear, normal light reflex with TMs without erythema, bulging. Good dentition. No erythema, swelling, or exudate on post pharynx. Tonsils not swollen or erythematous. Hearing normal.  Neck: Supple, thyroid normal. No bruits  Respiratory: Respiratory effort normal, BS equal bilaterally without rales, rhonchi, wheezing or stridor.  Cardio: RRR without murmurs, rubs or gallops. Brisk peripheral pulses without edema.  Chest: symmetric, with normal excursions and percussion.  Breasts: defer to GYN Abdomen: Soft, nontender, no guarding, rebound, hernias, masses, or organomegaly.  Lymphatics: Non tender  without lymphadenopathy.  Genitourinary: defer to GYN Musculoskeletal: Full ROM all peripheral extremities,5/5 strength, and normal gait. No obvious deformity, no effusions, heat, laxity.  Skin: Warm, dry without rashes, lesions, ecchymosis. Neuro: Cranial nerves intact, reflexes equal bilaterally. Normal muscle tone, no cerebellar symptoms. Sensation intact.  Psych: Awake and oriented X 3, normal affect, Insight and Judgment appropriate.   EKG : NSR, IRBBB  [facility] Mcjunkins W Breyer Tejera 2:18 PM [facility]

## 2021-09-04 ENCOUNTER — Encounter: Payer: Self-pay | Admitting: Nurse Practitioner

## 2021-09-04 ENCOUNTER — Encounter: Payer: BC Managed Care – PPO | Admitting: Adult Health

## 2021-09-04 ENCOUNTER — Ambulatory Visit (INDEPENDENT_AMBULATORY_CARE_PROVIDER_SITE_OTHER): Payer: 59 | Admitting: Nurse Practitioner

## 2021-09-04 ENCOUNTER — Other Ambulatory Visit: Payer: Self-pay

## 2021-09-04 VITALS — BP 130/82 | HR 74 | Temp 97.5°F | Ht 59.0 in | Wt 136.0 lb

## 2021-09-04 DIAGNOSIS — E8881 Metabolic syndrome: Secondary | ICD-10-CM | POA: Diagnosis not present

## 2021-09-04 DIAGNOSIS — Z0000 Encounter for general adult medical examination without abnormal findings: Secondary | ICD-10-CM

## 2021-09-04 DIAGNOSIS — Z1389 Encounter for screening for other disorder: Secondary | ICD-10-CM | POA: Diagnosis not present

## 2021-09-04 DIAGNOSIS — Z79899 Other long term (current) drug therapy: Secondary | ICD-10-CM

## 2021-09-04 DIAGNOSIS — E559 Vitamin D deficiency, unspecified: Secondary | ICD-10-CM

## 2021-09-04 DIAGNOSIS — Z6827 Body mass index (BMI) 27.0-27.9, adult: Secondary | ICD-10-CM

## 2021-09-04 DIAGNOSIS — I10 Essential (primary) hypertension: Secondary | ICD-10-CM

## 2021-09-04 DIAGNOSIS — Z0001 Encounter for general adult medical examination with abnormal findings: Secondary | ICD-10-CM

## 2021-09-04 DIAGNOSIS — E782 Mixed hyperlipidemia: Secondary | ICD-10-CM

## 2021-09-04 DIAGNOSIS — Z136 Encounter for screening for cardiovascular disorders: Secondary | ICD-10-CM | POA: Diagnosis not present

## 2021-09-04 NOTE — Patient Instructions (Signed)

## 2021-09-05 LAB — COMPLETE METABOLIC PANEL WITH GFR
AG Ratio: 1.7 (calc) (ref 1.0–2.5)
ALT: 21 U/L (ref 6–29)
AST: 21 U/L (ref 10–35)
Albumin: 4.7 g/dL (ref 3.6–5.1)
Alkaline phosphatase (APISO): 74 U/L (ref 37–153)
BUN: 16 mg/dL (ref 7–25)
CO2: 27 mmol/L (ref 20–32)
Calcium: 10.2 mg/dL (ref 8.6–10.4)
Chloride: 103 mmol/L (ref 98–110)
Creat: 0.7 mg/dL (ref 0.50–1.03)
Globulin: 2.8 g/dL (calc) (ref 1.9–3.7)
Glucose, Bld: 87 mg/dL (ref 65–99)
Potassium: 4.5 mmol/L (ref 3.5–5.3)
Sodium: 140 mmol/L (ref 135–146)
Total Bilirubin: 0.4 mg/dL (ref 0.2–1.2)
Total Protein: 7.5 g/dL (ref 6.1–8.1)
eGFR: 103 mL/min/{1.73_m2} (ref >=60)

## 2021-09-05 LAB — URINALYSIS, ROUTINE W REFLEX MICROSCOPIC
Bilirubin Urine: NEGATIVE
Glucose, UA: NEGATIVE
Hgb urine dipstick: NEGATIVE
Ketones, ur: NEGATIVE
Leukocytes,Ua: NEGATIVE
Nitrite: NEGATIVE
Protein, ur: NEGATIVE
Specific Gravity, Urine: 1.005 (ref 1.001–1.035)
pH: 6.5 (ref 5.0–8.0)

## 2021-09-05 LAB — CBC WITH DIFFERENTIAL/PLATELET
Absolute Monocytes: 561 cells/uL (ref 200–950)
Basophils Absolute: 46 cells/uL (ref 0–200)
Basophils Relative: 0.5 %
Eosinophils Absolute: 221 cells/uL (ref 15–500)
Eosinophils Relative: 2.4 %
HCT: 41.7 % (ref 35.0–45.0)
Hemoglobin: 13.9 g/dL (ref 11.7–15.5)
Lymphs Abs: 3128 cells/uL (ref 850–3900)
MCH: 30 pg (ref 27.0–33.0)
MCHC: 33.3 g/dL (ref 32.0–36.0)
MCV: 90.1 fL (ref 80.0–100.0)
MPV: 11 fL (ref 7.5–12.5)
Monocytes Relative: 6.1 %
Neutro Abs: 5244 cells/uL (ref 1500–7800)
Neutrophils Relative %: 57 %
Platelets: 326 10*3/uL (ref 140–400)
RBC: 4.63 10*6/uL (ref 3.80–5.10)
RDW: 12.5 % (ref 11.0–15.0)
Total Lymphocyte: 34 %
WBC: 9.2 10*3/uL (ref 3.8–10.8)

## 2021-09-05 LAB — LIPID PANEL
Cholesterol: 203 mg/dL — ABNORMAL HIGH (ref <200)
HDL: 79 mg/dL (ref >=50)
LDL Cholesterol (Calc): 104 mg/dL (calc) — ABNORMAL HIGH
Non-HDL Cholesterol (Calc): 124 mg/dL (calc) (ref <130)
Total CHOL/HDL Ratio: 2.6 (calc) (ref <5.0)
Triglycerides: 100 mg/dL (ref <150)

## 2021-09-05 LAB — TSH: TSH: 2.07 mIU/L

## 2021-09-05 LAB — MICROALBUMIN / CREATININE URINE RATIO
Creatinine, Urine: 17 mg/dL — ABNORMAL LOW (ref 20–275)
Microalb, Ur: <0.2 mg/dL

## 2021-09-05 LAB — HEMOGLOBIN A1C
Hgb A1c MFr Bld: 5.2 % of total Hgb (ref <5.7)
Mean Plasma Glucose: 103 mg/dL
eAG (mmol/L): 5.7 mmol/L

## 2021-09-05 LAB — VITAMIN D 25 HYDROXY (VIT D DEFICIENCY, FRACTURES): Vit D, 25-Hydroxy: 80 ng/mL (ref 30–100)

## 2021-09-05 LAB — MAGNESIUM: Magnesium: 2.3 mg/dL (ref 1.5–2.5)

## 2021-11-24 ENCOUNTER — Encounter: Payer: Self-pay | Admitting: Gastroenterology

## 2021-12-05 DIAGNOSIS — L821 Other seborrheic keratosis: Secondary | ICD-10-CM | POA: Diagnosis not present

## 2021-12-05 DIAGNOSIS — Z1283 Encounter for screening for malignant neoplasm of skin: Secondary | ICD-10-CM | POA: Diagnosis not present

## 2021-12-05 DIAGNOSIS — L218 Other seborrheic dermatitis: Secondary | ICD-10-CM | POA: Diagnosis not present

## 2021-12-05 DIAGNOSIS — B351 Tinea unguium: Secondary | ICD-10-CM | POA: Diagnosis not present

## 2021-12-20 ENCOUNTER — Other Ambulatory Visit: Payer: Self-pay

## 2021-12-20 ENCOUNTER — Ambulatory Visit (AMBULATORY_SURGERY_CENTER): Payer: 59 | Admitting: *Deleted

## 2021-12-20 VITALS — Ht 59.0 in | Wt 134.0 lb

## 2021-12-20 DIAGNOSIS — Z86010 Personal history of colonic polyps: Secondary | ICD-10-CM

## 2021-12-20 MED ORDER — NA SULFATE-K SULFATE-MG SULF 17.5-3.13-1.6 GM/177ML PO SOLN: 1.0000 | ORAL | 0 refills | Status: DC

## 2021-12-20 MED ORDER — ONDANSETRON HCL 4 MG PO TABS: 4.0000 mg | ORAL_TABLET | ORAL | 0 refills | Status: DC

## 2021-12-20 NOTE — Progress Notes (Signed)

## 2021-12-29 ENCOUNTER — Encounter: Payer: Self-pay | Admitting: Gastroenterology

## 2022-01-03 ENCOUNTER — Ambulatory Visit (AMBULATORY_SURGERY_CENTER): Payer: 59 | Admitting: Gastroenterology

## 2022-01-03 ENCOUNTER — Encounter: Payer: Self-pay | Admitting: Gastroenterology

## 2022-01-03 ENCOUNTER — Other Ambulatory Visit: Payer: Self-pay | Admitting: Gastroenterology

## 2022-01-03 ENCOUNTER — Other Ambulatory Visit: Payer: Self-pay

## 2022-01-03 VITALS — BP 121/52 | HR 81 | Temp 97.8°F | Resp 16 | Ht 59.0 in | Wt 134.0 lb

## 2022-01-03 DIAGNOSIS — D127 Benign neoplasm of rectosigmoid junction: Secondary | ICD-10-CM

## 2022-01-03 DIAGNOSIS — Z860100 Personal history of colon polyps, unspecified: Secondary | ICD-10-CM

## 2022-01-03 DIAGNOSIS — K635 Polyp of colon: Secondary | ICD-10-CM

## 2022-01-03 DIAGNOSIS — D123 Benign neoplasm of transverse colon: Secondary | ICD-10-CM

## 2022-01-03 MED ORDER — SODIUM CHLORIDE 0.9 % IV SOLN: 500.0000 mL | INTRAVENOUS | Status: DC

## 2022-01-03 NOTE — Op Note (Signed)
[place] ?Patient Name: Tricia Berger ?Procedure Date: 01/03/2022 11:54 AM ?MRN: [id number] ?Endoscopist: Carlota Raspberry. Havery Moros , MD ?Age: 56 ?Referring MD:  ?Date of Birth: [birth year] ?Gender: Female ?Account #: [id number] ?Procedure:                Colonoscopy ?Indications:              High risk colon cancer surveillance: Personal  ?                          history of colonic polyps - advanced polyps removed  ?                          04/2021 - ascending / hepatic flexure - adenomas /  ?                          SSP, removed in piecemeal, here for close  ?                          surveillance follow up given modality removed last  ?                          exam ?Medicines:                Monitored Anesthesia Care ?Procedure:                Pre-Anesthesia Assessment: ?                          - Prior to the procedure, a History and Physical  ?                          was performed, and patient medications and  ?                          allergies were reviewed. The patient's tolerance of  ?                          previous anesthesia was also reviewed. The risks  ?                          and benefits of the procedure and the sedation  ?                          options and risks were discussed with the patient.  ?                          All questions were answered, and informed consent  ?                          was obtained. Prior Anticoagulants: The patient has  ?                          taken no previous anticoagulant or antiplatelet  ?  agents. ASA Grade Assessment: II - A patient with  ?                          mild systemic disease. After reviewing the risks  ?                          and benefits, the patient was deemed in  ?                          satisfactory condition to undergo the procedure. ?                          After obtaining informed consent, the colonoscope  ?                          was passed under direct vision. Throughout the  ?                           procedure, the patient's blood pressure, pulse, and  ?                          oxygen saturations were monitored continuously. The  ?                          Olympus PCF-H190DL [id number] Colonoscope was  ?                          introduced through the anus and advanced to the the  ?                          cecum, identified by appendiceal orifice and  ?                          ileocecal valve. The colonoscopy was performed  ?                          without difficulty. The patient tolerated the  ?                          procedure well. The quality of the bowel  ?                          preparation was good. The ileocecal valve,  ?                          appendiceal orifice, and rectum were photographed. ?Scope In: 12:07:54 PM ?Scope Out: 12:27:41 PM ?Scope Withdrawal Time: 0 hours 16 minutes 15 seconds  ?Total Procedure Duration: 0 hours 19 minutes 47 seconds  ?Findings:                 The perianal and digital rectal examinations were  ?                          normal. ?  A tattoo was seen at the hepatic flexure and in the  ?                          ascending colon. Post-polypectomy scars were found  ?                          at the tattoo sites and no evidence of residual  ?                          polypoid tissue. ?                          A 8 mm polyp was found in the transverse colon. The  ?                          polyp was flat and quite subtle. The polyp was  ?                          removed with a cold snare. Resection and retrieval  ?                          were complete. ?                          A 3 mm polyp was found in the recto-sigmoid colon.  ?                          The polyp was sessile. The polyp was removed with a  ?                          cold snare. Resection and retrieval were complete. ?                          Internal hemorrhoids were found during  ?                          retroflexion. The hemorrhoids were small. ?                           The exam was otherwise without abnormality. ?Complications:            No immediate complications. Estimated blood loss:  ?                          Minimal. ?Estimated Blood Loss:     Estimated blood loss was minimal. ?Impression:               - A tattoo was seen at the hepatic flexure and in  ?                          the ascending colon. Post-polypectomy scars found  ?                          at the tattoo site and look good. ?                          -  One 8 mm polyp in the transverse colon, removed  ?                          with a cold snare. Resected and retrieved. ?                          - One 3 mm polyp at the recto-sigmoid colon,  ?                          removed with a cold snare. Resected and retrieved. ?                          - Internal hemorrhoids. ?                          - The examination was otherwise normal. ?Recommendation:           - Patient has a contact number available for  ?                          emergencies. The signs and symptoms of potential  ?                          delayed complications were discussed with the  ?                          patient. Return to normal activities tomorrow.  ?                          Written discharge instructions were provided to the  ?                          patient. ?                          - Resume previous diet. ?                          - Continue present medications. ?                          - Await pathology results. ?                          - Repeat colonoscopy in 3 years for surveillance  ?                          given advanced polyps removed on the last exam. ?Remo Lipps P. Havery Moros, MD ?01/03/2022 12:35:51 PM ?This report has been signed electronically. ?

## 2022-01-03 NOTE — Progress Notes (Signed)
Called to room to assist during endoscopic procedure.  Patient ID and intended procedure confirmed with present staff. Received instructions for my participation in the procedure from the performing physician.  

## 2022-01-03 NOTE — Progress Notes (Signed)
PT taken to PACU. Monitors in place. VSS. Report given to RN. 

## 2022-01-03 NOTE — Patient Instructions (Signed)
Handouts on polyps and hemorrhoids given.   ? ? ?YOU HAD AN ENDOSCOPIC PROCEDURE TODAY AT [facility]:   Refer to the procedure report that was given to you for any specific questions about what was found during the examination.  If the procedure report does not answer your questions, please call your gastroenterologist to clarify.  If you requested that your care partner not be given the details of your procedure findings, then the procedure report has been included in a sealed envelope for you to review at your convenience later. ? ?YOU SHOULD EXPECT: Some feelings of bloating in the abdomen. Passage of more gas than usual.  Walking can help get rid of the air that was put into your GI tract during the procedure and reduce the bloating. If you had a lower endoscopy (such as a colonoscopy or flexible sigmoidoscopy) you may notice spotting of blood in your stool or on the toilet paper. If you underwent a bowel prep for your procedure, you may not have a normal bowel movement for a few days. ? ?Please Note:  You might notice some irritation and congestion in your nose or some drainage.  This is from the oxygen used during your procedure.  There is no need for concern and it should clear up in a day or so. ? ?SYMPTOMS TO REPORT IMMEDIATELY: ? ?Following lower endoscopy (colonoscopy or flexible sigmoidoscopy): ? Excessive amounts of blood in the stool ? Significant tenderness or worsening of abdominal pains ? Swelling of the abdomen that is new, acute ? Fever of 100?F or higher ? ? ?For urgent or emergent issues, a gastroenterologist can be reached at any hour by calling [phone]. ?Do not use MyChart messaging for urgent concerns.  ? ? ?DIET:  We do recommend a small meal at first, but then you may proceed to your regular diet.  Drink plenty of fluids but you should avoid alcoholic beverages for 24 hours. ? ?ACTIVITY:  You should plan to take it easy for the rest of today and you should NOT DRIVE  or use heavy machinery until tomorrow (because of the sedation medicines used during the test).   ? ?FOLLOW UP: ?Our staff will call the number listed on your records 48-72 hours following your procedure to check on you and address any questions or concerns that you may have regarding the information given to you following your procedure. If we do not reach you, we will leave a message.  We will attempt to reach you two times.  During this call, we will ask if you have developed any symptoms of COVID 19. If you develop any symptoms (ie: fever, flu-like symptoms, shortness of breath, cough etc.) before then, please call [phone].  If you test positive for Covid 19 in the 2 weeks post procedure, please call and report this information to [place].   ? ?If any biopsies were taken you will be contacted by phone or by letter within the next 1-3 weeks.  Please call us at [phone] if you have not heard about the biopsies in 3 weeks.  ? ? ?SIGNATURES/CONFIDENTIALITY: ?You and/or your care partner have signed paperwork which will be entered into your electronic medical record.  These signatures attest to the fact that that the information above on your After Visit Summary has been reviewed and is understood.  Full responsibility of the confidentiality of this discharge information lies with you and/or your care-partner.  ?

## 2022-01-03 NOTE — Progress Notes (Signed)
[facility] History and Physical ? ? ?Primary Care Physician:  Unk Pinto, MD ? ? ?Reason for Procedure:   History of colon polyps ? ?Plan:    colonoscopy ? ? ? ? ?HPI: Tricia Berger is a 56 y.o. female  here for colonoscopy  surveillance - exam in July 2022 - 2 advanced polyps removed in piecemeal - adenoma and SSP. Patient denies any bowel symptoms at this time. No family history of colon cancer known. Otherwise feels well without any cardiopulmonary symptoms.  ? ? ?Past Medical History:  ?Diagnosis Date  ? Gestational diabetes mellitus, Hx 07/05/2014  ? 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 Reaction, adjustment, with anxious, depressed mood 09/02/2019  ? Seasonal allergies   ? ? ?Past Surgical History:  ?Procedure Laterality Date  ? ANTERIOR CRUCIATE LIGAMENT REPAIR Left 02/2017  ? With meniscectomy  ? ARTHROSCOPIC REPAIR ACL Left 02/2017  ? COLONOSCOPY  04/28/2021  ? Dr.Zebulin Siegel  ? POLYPECTOMY    ? WISDOM TOOTH EXTRACTION    ? ? ?Prior to Admission medications   ?Medication Sig Start Date End Date Taking? Authorizing Provider  ?cetirizine (ZYRTEC) 10 MG tablet Take 10 mg by mouth daily. OTC   Yes [provider]  ?ondansetron (ZOFRAN) 4 MG tablet Take 1 tablet (4 mg total) by mouth as directed. Take one Zofran pill 30-60 minutes before each colonoscopy prep dose 12/20/21  Yes Arian Murley, Carlota Raspberry, MD  ?Cholecalciferol (VITAMIN D PO) Take 2,000 Int'l Units by mouth daily.     [provider]  ?clobetasol (TEMOVATE) 0.05 % external solution Apply topically 2 (two) times daily as needed. 02/24/21   [provider]  ?TURMERIC PO Take 1 tablet by mouth daily.    [provider]  ?vitamin C (ASCORBIC ACID) 500 MG tablet Take 500 mg by mouth daily.    [provider]  ? ? ?Current Outpatient Medications  ?Medication Sig Dispense Refill  ? cetirizine (ZYRTEC) 10 MG tablet Take 10 mg by mouth daily. OTC    ? ondansetron (ZOFRAN) 4 MG tablet Take 1 tablet (4 mg  total) by mouth as directed. Take one Zofran pill 30-60 minutes before each colonoscopy prep dose 2 tablet 0  ? Cholecalciferol (VITAMIN D PO) Take 2,000 Int'l Units by mouth daily.     ? clobetasol (TEMOVATE) 0.05 % external solution Apply topically 2 (two) times daily as needed.    ? TURMERIC PO Take 1 tablet by mouth daily.    ? vitamin C (ASCORBIC ACID) 500 MG tablet Take 500 mg by mouth daily.    ? ?Current Facility-Administered Medications  ?Medication Dose Route Frequency Provider Last Rate Last Admin  ? 0.9 %  sodium chloride infusion  500 mL Intravenous Continuous Lucianne Smestad, Carlota Raspberry, MD      ? ? ?Allergies as of 01/03/2022  ? (No Known Allergies)  ? ? ?Family History  ?Problem Relation Age of Onset  ? Hypertension Mother   ? Breast cancer Maternal Aunt 60  ? Heart disease Maternal Grandmother   ? Breast cancer Maternal Grandmother 44  ? Heart disease Maternal Grandfather   ? Diabetes type II Maternal Grandfather   ? Colon polyps Neg Hx   ? Colon cancer Neg Hx   ? Esophageal cancer Neg Hx   ? Stomach cancer Neg Hx   ? Rectal cancer Neg Hx   ? ? ?Social History  ? ?Socioeconomic History  ? Marital status: Married  ?  Spouse name: Not on file  ? Number of  children: 2  ? Years of education: Not on file  ? Highest education level: Not on file  ?Occupational History  ? Not on file  ?Tobacco Use  ? Smoking status: Never  ? Smokeless tobacco: Never  ?Vaping Use  ? Vaping Use: Never used  ?Substance and Sexual Activity  ? Alcohol use: No  ?  Comment: Social, very rarely 1-2 drinks annually  ? Drug use: No  ? Sexual activity: Yes  ?  Partners: Male  ?  Birth control/protection: Surgical  ?  Comment: Vasectomy   ?Other Topics Concern  ? Not on file  ?Social History Narrative  ? Not on file  ? ?Social Determinants of Health  ? ?Financial Resource Strain: Not on file  ?Food Insecurity: Not on file  ?Transportation Needs: Not on file  ?Physical Activity: Not on file  ?Stress: Not on file  ?Social Connections: Not on  file  ?Intimate Partner Violence: Not on file  ? ? ?Review of Systems: ?All other review of systems negative except as mentioned in the HPI. ? ?Physical Exam: ?Vital signs ?BP (!) 125/57   Pulse 94   Temp 97.8 ?F (36.6 ?C)   Ht '4\' 11"'$  (1.499 m)   Wt 134 lb (60.8 kg)   LMP 08/20/2017   SpO2 99%   BMI 27.06 kg/m?  ? ?General:   Alert,  Well-developed, pleasant and cooperative in NAD ?Lungs:  Clear throughout to auscultation.   ?Heart:  Regular rate and rhythm ?Abdomen:  Soft, nontender and nondistended.   ?Neuro/Psych:  Alert and cooperative. Normal mood and affect. A and O x 3 ? ?Jolly Mango, MD ?[facility] ? ? ?

## 2022-01-05 ENCOUNTER — Telehealth: Payer: Self-pay

## 2022-01-05 NOTE — Telephone Encounter (Signed)
?  Follow up Call- ? ?Call back number 01/03/2022 04/28/2021  ?Post procedure Call Back phone  # [phone]  ?Permission to leave phone message Yes Yes  ?Some recent data might be hidden  ?  ? ?Patient questions: ? ?Do you have a fever, pain , or abdominal swelling? No. ?Pain Score  0 * ? ?Have you tolerated food without any problems? Yes.   ? ?Have you been able to return to your normal activities? Yes.   ? ?Do you have any questions about your discharge instructions: ?Diet   No. ?Medications  No. ?Follow up visit  No. ? ?Do you have questions or concerns about your Care? No. ? ?Actions: ?* If pain score is 4 or above: ?No action needed, pain <4. ? ? ?

## 2022-08-23 DIAGNOSIS — Z1231 Encounter for screening mammogram for malignant neoplasm of breast: Secondary | ICD-10-CM | POA: Diagnosis not present

## 2022-08-23 DIAGNOSIS — Z124 Encounter for screening for malignant neoplasm of cervix: Secondary | ICD-10-CM | POA: Diagnosis not present

## 2022-08-23 DIAGNOSIS — Z01419 Encounter for gynecological examination (general) (routine) without abnormal findings: Secondary | ICD-10-CM | POA: Diagnosis not present

## 2022-08-23 LAB — RESULTS CONSOLE HPV: CHL HPV: NEGATIVE

## 2022-08-23 LAB — HM PAP SMEAR

## 2022-09-03 NOTE — Progress Notes (Unsigned)
 Complete Physical  Assessment and Plan:  Mckennah was seen today for annual exam.  Diagnoses and all orders for this visit:  Encounter for routine adult health examination with abnormal findings Continue annual GYN follow up, regular derm, vision, dental screenings Flu vaccine administered today  Lifestyle discussed  Abnormal glucose -     Continue healthy diet/exercise, weight maintenance -     Hemoglobin A1c  Vitamin D deficiency Continue Vit D supplementation to maintain value in therapeutic level of 60-100  -     VITAMIN D 25 Hydroxy (Vit-D Deficiency, Fractures)  Medication management -     CBC with Differential/Platelet -     CMP/GFR -     Magnesium  Overweight BMI 27-27.9 Long discussion about weight loss, diet, and exercise Recommended diet heavy in fruits and veggies and low in animal meats, cheeses, and dairy products, appropriate calorie intake She will try to increase activity  Screening for ischemic heart disease -     EKG 12-Lead  Screening for hematuria or proteinuria Has seen nephro/uro; negative cysto; check urine annually  Routine UA with reflex microscopic Microalbumin/creatinine urine ratio  Hyperlipidemia  Mild, not currently on treatment Continue low cholesterol diet and exercise.  Check lipid panel.  -     TSH -     Lipid panel  Screening for thyroid disorder - TSH  Screening for AAA - U/S ABD Retroperitoneal LTD    Discussed med's effects and SE's. Screening labs and tests as requested with regular follow-up as recommended. Over 40 minutes of exam, counseling, chart review, and complex, high level critical decision making was performed this visit.   Future Appointments  Date Time Provider [place]  09/05/2023  2:00 PM Alycia Rossetti, NP [facility] None    HPI  56 y.o. very healthy married female, presents for a complete physical and follow up for has Insulin resistance, Hx; Vitamin D deficiency; Medication management;  Environmental allergies; BMI 25.0-25.9,adult; Hyperlipidemia; and Polyarthralgia on their problem list.   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   She is followed annually by GYN Dr. Ouida Sills at [facility] and performs monthly breast checks. She was having hot flashes but has been started on Estradiol and Progesterone. She has only been on hormones for 2 weeks but climacteric symptoms and insomnia has approved.   She was referred to urology/nephrology for concerns of persistent hemoglobinuria,  She had serum workup and kidney ultrasound by Dr. Hollie Salk in 04/2018 with benign workup, urology did cysto which was negative. Has since spontaneously resolved, monitoring annually at this office.   Has been having some low back pain and pain in heels. States pain is worst in the morning and gets better as the day progresses.  Occasional Ibuprofen which relieves slightly. She reported polyarthralgia, symmetrical, bil knees, hips, hands, in the last 1-2 years with AM stiffness, some fatigue, with ? History of RA in MGM, recalls significant deviation deformities in hands.  Has had sed, CRP, RF, anti-DNA, anti CCP, anti smith; had ANA + 1:80 with reassuring Dense fine speckled pattern. Exam essentially normal. Continues to have joint pains.   BMI is Body mass index is 28.4 kg/m., she has been working on diet and exercise,  just maintaining in the last year, goal is 117 lb.She had not been exercising as much due to fatigue from insomnia but has improved with HRT.  Admits diet could be better She drinks 3 bottles of  water, also sugar free beverages/flavored water Coffee - 16 oz/day Wt Readings from Last 3 Encounters:  09/04/22 140 lb 9.6 oz (63.8 kg)  01/03/22 134 lb (60.8 kg)  12/20/21 134 lb (60.8 kg)   Her blood pressure today is BP: 120/80  BP Readings from Last 3 Encounters:  09/04/22 120/80  01/03/22 (!) 121/52  09/04/21 130/82   She does workout. She denies chest pain, shortness of breath, dizziness.   She is not on cholesterol medication and denies myalgias. Her cholesterol is not at goal. She does limit red meat, dairy and eggs. The cholesterol last visit was:   Lab Results  Component Value Date   CHOL 203 (H) 09/04/2021   HDL 79 09/04/2021   LDLCALC 104 (H) 09/04/2021   TRIG 100 09/04/2021   CHOLHDL 2.6 09/04/2021   She has been working on diet and exercise for history of insulin resistance. Last A1C in the office was:  Lab Results  Component Value Date   HGBA1C 5.2 09/04/2021   Last GFR: Lab Results  Component Value Date   EGFR 103 09/04/2021    Patient is on Vitamin D supplement (2000 IU daily) and at goal:   Lab Results  Component Value Date   VD25OH 80 09/04/2021       Current Medications:  Current Outpatient Medications on File Prior to Visit  Medication Sig Dispense Refill   cetirizine (ZYRTEC) 10 MG tablet Take 10 mg by mouth daily. OTC     Cholecalciferol (VITAMIN D PO) Take 2,000 Int'l Units by mouth daily.      clobetasol (TEMOVATE) 0.05 % external solution Apply topically 2 (two) times daily as needed.     estradiol (CLIMARA - DOSED IN MG/24 HR) 0.05 mg/24hr patch Place 0.05 mg onto the skin once a week.     ondansetron (ZOFRAN) 4 MG tablet Take 1 tablet (4 mg total) by mouth as directed. Take one Zofran pill 30-60 minutes before each colonoscopy prep dose 2 tablet 0   progesterone (PROMETRIUM) 100 MG capsule Take 100 mg by mouth at bedtime.     TURMERIC PO Take 1 tablet by mouth daily.     vitamin C (ASCORBIC ACID) 500 MG tablet Take 500 mg by mouth daily.     No current facility-administered medications on file prior to visit.   Allergies:  No Known Allergies Medical History:  She has Insulin resistance, Hx; Vitamin D deficiency; Medication management; Environmental allergies; BMI 25.0-25.9,adult; Hyperlipidemia; and Polyarthralgia on their problem list. Health Maintenance:    Immunization History  Administered Date(s) Administered   Influenza Inj Mdck Quad With Preservative 06/23/2019, 09/01/2020   Influenza-Unspecified 08/01/2016, 08/07/2017, 08/06/2018, 07/09/2021   PFIZER(Purple Top)SARS-COV-2 Vaccination 01/05/2020, 01/25/2020, 07/09/2021   PPD Test 07/05/2014, 07/07/2015, 08/06/2016   Pneumococcal Polysaccharide-23 06/01/2010   Td 10/22/2004   Tdap 07/07/2015    Tetanus: 2016 Flu vaccine: 07/09/21 plans to get at [facility] Shingrix: will check with insurance about shingrix  Covid 19: 2/2, 2021, pfizer  LMP: Patient's last menstrual period was 08/20/2017. Pelvic/Pap: 06/2021 - by GYN Dr. Ouida Sills MGM: 06/2022  by GYN DEXA: n/a  Colonoscopy: Dr. Havery Moros -12/2021 repeat colonoscopy in 3 years for surveillance  EGD: n/a  Last Dental Exam: Dr. Lavonne Chick, 2022, q 6 months Last Eye Exam: Dr. Sherral Hammers, 07/2021, wears contacts, no issues, goes annually Last Derm Exam: Dr. Chana Bode, goes q12 months, no concerning lesions  Patient Care Team: Unk Pinto, MD as PCP - General (Internal Medicine) Deirdre Pippins, PA-C as Physician  Assistant (Internal Medicine) Festus Aloe, MD as Consulting Physician (Urology)  Surgical History:  She has a past surgical history that includes Anterior cruciate ligament repair (Left, 02/2017); Wisdom tooth extraction; Arthroscopic repair ACL (Left, 02/2017); Colonoscopy (04/28/2021); and Polypectomy. Family History:  Herfamily history includes Breast cancer (age of onset: 34) in her maternal aunt; Breast cancer (age of onset: 26) in her maternal grandmother; Diabetes type II in her maternal grandfather; Heart disease in her maternal grandfather and maternal grandmother; Hypertension in her mother. Social History:  She reports that she has never smoked. She has never used smokeless tobacco. She reports that she does not drink alcohol and does not use drugs.   Review of Systems: Review of Systems  Constitutional:   Negative for chills, fever, malaise/fatigue and weight loss.  HENT:  Negative for congestion, hearing loss, sinus pain, sore throat and tinnitus.   Eyes:  Negative for blurred vision and double vision.  Respiratory:  Negative for cough, hemoptysis, sputum production, shortness of breath and wheezing.   Cardiovascular:  Negative for chest pain, palpitations, orthopnea, claudication and leg swelling.  Gastrointestinal:  Negative for abdominal pain, blood in stool, constipation, diarrhea, heartburn, melena, nausea and vomiting.  Genitourinary: Negative.  Negative for dysuria and urgency.       Hot Flashes- controlled with new HRT  Musculoskeletal:  Positive for joint pain (overall joint pain). Negative for back pain, falls, myalgias and neck pain.  Skin:  Negative for rash.  Neurological:  Negative for dizziness, tingling, tremors, sensory change, weakness and headaches.  Endo/Heme/Allergies:  Negative for polydipsia. Does not bruise/bleed easily.  Psychiatric/Behavioral:  Negative for depression, substance abuse and suicidal ideas. The patient has insomnia. The patient is not nervous/anxious.   All other systems reviewed and are negative.   Physical Exam: Estimated body mass index is 28.4 kg/m as calculated from the following:   Height as of this encounter: _0  (1.499 m).   Weight as of this encounter: 140 lb 9.6 oz (63.8 kg). BP 120/80   Pulse 88   Temp 97.9 F (36.6 C)   Resp 16   Ht _1  (1.499 m)   Wt 140 lb 9.6 oz (63.8 kg)   LMP 08/20/2017   SpO2 97%   BMI 28.40 kg/m  General Appearance: Well nourished, in no apparent distress.  Eyes: PERRLA, EOMs, conjunctiva no swelling or erythema  Sinuses: No Frontal/maxillary tenderness  ENT/Mouth: Ext aud canals clear, normal light reflex with TMs without erythema, bulging. Good dentition. No erythema, swelling, or exudate on post pharynx. Tonsils not swollen or erythematous. Hearing normal.  Neck: Supple, thyroid normal. No bruits   Respiratory: Respiratory effort normal, BS equal bilaterally without rales, rhonchi, wheezing or stridor.  Cardio: RRR without murmurs, rubs or gallops. Brisk peripheral pulses without edema.  Chest: symmetric, with normal excursions and percussion.  Breasts: defer to GYN Abdomen: Soft, nontender, no guarding, rebound, hernias, masses, or organomegaly.  Lymphatics: Non tender without lymphadenopathy.  Genitourinary: defer to GYN Musculoskeletal: Full ROM all peripheral extremities,5/5 strength, and normal gait. No obvious deformity, no effusions, heat, laxity.  Skin: Warm, dry without rashes, lesions, ecchymosis. Neuro: Cranial nerves intact, reflexes equal bilaterally. Normal muscle tone, no cerebellar symptoms. Sensation intact.  Psych: Awake and oriented X 3, normal affect, Insight and Judgment appropriate.   EKG : NSR, IRBBB, no ST changes AAA: < 3 cm  Kazi Montoro E  2:02 PM [facility]

## 2022-09-04 ENCOUNTER — Ambulatory Visit (INDEPENDENT_AMBULATORY_CARE_PROVIDER_SITE_OTHER): Payer: 59 | Admitting: Nurse Practitioner

## 2022-09-04 ENCOUNTER — Encounter: Payer: Self-pay | Admitting: Nurse Practitioner

## 2022-09-04 VITALS — BP 120/80 | HR 88 | Temp 97.9°F | Resp 16 | Ht 59.0 in | Wt 140.6 lb

## 2022-09-04 DIAGNOSIS — I700 Atherosclerosis of aorta: Secondary | ICD-10-CM | POA: Diagnosis not present

## 2022-09-04 DIAGNOSIS — Z79899 Other long term (current) drug therapy: Secondary | ICD-10-CM

## 2022-09-04 DIAGNOSIS — E782 Mixed hyperlipidemia: Secondary | ICD-10-CM

## 2022-09-04 DIAGNOSIS — Z0000 Encounter for general adult medical examination without abnormal findings: Secondary | ICD-10-CM | POA: Diagnosis not present

## 2022-09-04 DIAGNOSIS — R7309 Other abnormal glucose: Secondary | ICD-10-CM

## 2022-09-04 DIAGNOSIS — Z1329 Encounter for screening for other suspected endocrine disorder: Secondary | ICD-10-CM | POA: Diagnosis not present

## 2022-09-04 DIAGNOSIS — E559 Vitamin D deficiency, unspecified: Secondary | ICD-10-CM

## 2022-09-04 DIAGNOSIS — I10 Essential (primary) hypertension: Secondary | ICD-10-CM

## 2022-09-04 DIAGNOSIS — Z1389 Encounter for screening for other disorder: Secondary | ICD-10-CM

## 2022-09-04 DIAGNOSIS — Z136 Encounter for screening for cardiovascular disorders: Secondary | ICD-10-CM | POA: Diagnosis not present

## 2022-09-04 DIAGNOSIS — Z0001 Encounter for general adult medical examination with abnormal findings: Secondary | ICD-10-CM

## 2022-09-04 DIAGNOSIS — E663 Overweight: Secondary | ICD-10-CM

## 2022-09-04 DIAGNOSIS — Z6827 Body mass index (BMI) 27.0-27.9, adult: Secondary | ICD-10-CM

## 2022-09-04 NOTE — Patient Instructions (Signed)

## 2022-09-05 LAB — COMPLETE METABOLIC PANEL WITH GFR
AG Ratio: 1.6 (calc) (ref 1.0–2.5)
ALT: 19 U/L (ref 6–29)
AST: 20 U/L (ref 10–35)
Albumin: 4.4 g/dL (ref 3.6–5.1)
Alkaline phosphatase (APISO): 77 U/L (ref 37–153)
BUN: 18 mg/dL (ref 7–25)
CO2: 25 mmol/L (ref 20–32)
Calcium: 9.1 mg/dL (ref 8.6–10.4)
Chloride: 104 mmol/L (ref 98–110)
Creat: 0.83 mg/dL (ref 0.50–1.03)
Globulin: 2.7 g/dL (calc) (ref 1.9–3.7)
Glucose, Bld: 87 mg/dL (ref 65–99)
Potassium: 4.4 mmol/L (ref 3.5–5.3)
Sodium: 139 mmol/L (ref 135–146)
Total Bilirubin: 0.3 mg/dL (ref 0.2–1.2)
Total Protein: 7.1 g/dL (ref 6.1–8.1)
eGFR: 84 mL/min/{1.73_m2} (ref >=60)

## 2022-09-05 LAB — URINALYSIS, ROUTINE W REFLEX MICROSCOPIC
Bilirubin Urine: NEGATIVE
Glucose, UA: NEGATIVE
Hgb urine dipstick: NEGATIVE
Ketones, ur: NEGATIVE
Leukocytes,Ua: NEGATIVE
Nitrite: NEGATIVE
Protein, ur: NEGATIVE
Specific Gravity, Urine: 1.005 (ref 1.001–1.035)
pH: 6 (ref 5.0–8.0)

## 2022-09-05 LAB — CBC WITH DIFFERENTIAL/PLATELET
Absolute Monocytes: 647 cells/uL (ref 200–950)
Basophils Absolute: 69 cells/uL (ref 0–200)
Basophils Relative: 0.7 %
Eosinophils Absolute: 206 cells/uL (ref 15–500)
Eosinophils Relative: 2.1 %
HCT: 39.8 % (ref 35.0–45.0)
Hemoglobin: 13.5 g/dL (ref 11.7–15.5)
Lymphs Abs: 3254 cells/uL (ref 850–3900)
MCH: 30.5 pg (ref 27.0–33.0)
MCHC: 33.9 g/dL (ref 32.0–36.0)
MCV: 89.8 fL (ref 80.0–100.0)
MPV: 10.8 fL (ref 7.5–12.5)
Monocytes Relative: 6.6 %
Neutro Abs: 5625 cells/uL (ref 1500–7800)
Neutrophils Relative %: 57.4 %
Platelets: 339 10*3/uL (ref 140–400)
RBC: 4.43 10*6/uL (ref 3.80–5.10)
RDW: 12.5 % (ref 11.0–15.0)
Total Lymphocyte: 33.2 %
WBC: 9.8 10*3/uL (ref 3.8–10.8)

## 2022-09-05 LAB — LIPID PANEL
Cholesterol: 200 mg/dL — ABNORMAL HIGH (ref <200)
HDL: 75 mg/dL (ref >=50)
LDL Cholesterol (Calc): 100 mg/dL (calc) — ABNORMAL HIGH
Non-HDL Cholesterol (Calc): 125 mg/dL (calc) (ref <130)
Total CHOL/HDL Ratio: 2.7 (calc) (ref <5.0)
Triglycerides: 149 mg/dL (ref <150)

## 2022-09-05 LAB — VITAMIN D 25 HYDROXY (VIT D DEFICIENCY, FRACTURES): Vit D, 25-Hydroxy: 75 ng/mL (ref 30–100)

## 2022-09-05 LAB — TSH: TSH: 2.51 mIU/L

## 2022-09-05 LAB — HEMOGLOBIN A1C
Hgb A1c MFr Bld: 5.4 % of total Hgb (ref <5.7)
Mean Plasma Glucose: 108 mg/dL
eAG (mmol/L): 6 mmol/L

## 2022-09-05 LAB — MICROALBUMIN / CREATININE URINE RATIO
Creatinine, Urine: 21 mg/dL (ref 20–275)
Microalb, Ur: <0.2 mg/dL

## 2022-09-05 LAB — MAGNESIUM: Magnesium: 2.1 mg/dL (ref 1.5–2.5)

## 2022-09-20 DIAGNOSIS — M25561 Pain in right knee: Secondary | ICD-10-CM | POA: Diagnosis not present

## 2022-10-25 DIAGNOSIS — M25561 Pain in right knee: Secondary | ICD-10-CM | POA: Diagnosis not present

## 2022-11-26 DIAGNOSIS — Z1283 Encounter for screening for malignant neoplasm of skin: Secondary | ICD-10-CM | POA: Diagnosis not present

## 2022-11-26 DIAGNOSIS — D225 Melanocytic nevi of trunk: Secondary | ICD-10-CM | POA: Diagnosis not present

## 2023-08-27 DIAGNOSIS — Z01419 Encounter for gynecological examination (general) (routine) without abnormal findings: Secondary | ICD-10-CM | POA: Diagnosis not present

## 2023-08-27 DIAGNOSIS — Z1231 Encounter for screening mammogram for malignant neoplasm of breast: Secondary | ICD-10-CM | POA: Diagnosis not present

## 2023-08-27 DIAGNOSIS — Z124 Encounter for screening for malignant neoplasm of cervix: Secondary | ICD-10-CM | POA: Diagnosis not present

## 2023-08-27 LAB — HM MAMMOGRAPHY

## 2023-08-30 ENCOUNTER — Other Ambulatory Visit: Payer: Self-pay | Admitting: Obstetrics and Gynecology

## 2023-08-30 DIAGNOSIS — R928 Other abnormal and inconclusive findings on diagnostic imaging of breast: Secondary | ICD-10-CM

## 2023-08-31 LAB — HM PAP SMEAR: HPV, high-risk: NEGATIVE

## 2023-09-03 ENCOUNTER — Inpatient Hospital Stay
Admission: RE | Admit: 2023-09-03 | Discharge: 2023-09-03 | Discharge status: Home / Self Care | Payer: 59 | Source: Ambulatory Visit | Attending: Obstetrics and Gynecology

## 2023-09-03 ENCOUNTER — Ambulatory Visit
Admission: RE | Admit: 2023-09-03 | Discharge: 2023-09-03 | Disposition: A | Discharge status: Home / Self Care | Payer: 59 | Source: Ambulatory Visit | Attending: Obstetrics and Gynecology | Admitting: Obstetrics and Gynecology

## 2023-09-03 DIAGNOSIS — R928 Other abnormal and inconclusive findings on diagnostic imaging of breast: Secondary | ICD-10-CM

## 2023-09-03 DIAGNOSIS — N6011 Diffuse cystic mastopathy of right breast: Secondary | ICD-10-CM | POA: Diagnosis not present

## 2023-09-03 DIAGNOSIS — N6002 Solitary cyst of left breast: Secondary | ICD-10-CM | POA: Diagnosis not present

## 2023-09-04 NOTE — Progress Notes (Unsigned)
 Complete Physical  Assessment and Plan:  Tricia Berger was seen today for annual exam.  Diagnoses and all orders for this visit:  Encounter for routine adult health examination with abnormal findings Continue annual GYN follow up, regular derm, vision, dental screenings Flu vaccine administered today  Lifestyle discussed  Abnormal glucose -     Continue healthy diet/exercise, weight maintenance -     Hemoglobin A1c  Vitamin D deficiency Continue Vit D supplementation to maintain value in therapeutic level of 60-100  -     VITAMIN D 25 Hydroxy (Vit-D Deficiency, Fractures)  Medication management -     CBC with Differential/Platelet -     CMP/GFR -     Magnesium  Overweight BMI 27-27.9 Long discussion about weight loss, diet, and exercise Recommended diet heavy in fruits and veggies and low in animal meats, cheeses, and dairy products, appropriate calorie intake She will try to increase activity  Screening for ischemic heart disease -     EKG 12-Lead  Screening for hematuria or proteinuria Has seen nephro/uro; negative cysto; check urine annually  Routine UA with reflex microscopic Microalbumin/creatinine urine ratio  Hyperlipidemia  Mild, not currently on treatment Continue low cholesterol diet and exercise.  Check lipid panel.  -     TSH -     Lipid panel  Screening for thyroid disorder - TSH  Screening for AAA - U/S ABD Retroperitoneal LTD    Discussed med's effects and SE's. Screening labs and tests as requested with regular follow-up as recommended. Over 40 minutes of exam, counseling, chart review, and complex, high level critical decision making was performed this visit.   Future Appointments  Date Time Provider Department Center  09/05/2023  2:00 PM Raynelle Dick, NP [facility] None  09/07/2024  2:00 PM Raynelle Dick, NP [facility] None    HPI  57 y.o. very healthy married female, presents for a complete physical and follow up for has Insulin  resistance, Hx; Vitamin D deficiency; Medication management; Environmental allergies; BMI 25.0-25.9,adult; Hyperlipidemia; and Polyarthralgia on their problem list.   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   She is followed annually by GYN Dr. Dareen Piano at [facility] and performs monthly breast checks. She was having hot flashes but has been started on Estradiol and Progesterone. She has only been on hormones for 2 weeks but climacteric symptoms and insomnia has approved.   She was referred to urology/nephrology for concerns of persistent hemoglobinuria,  She had serum workup and kidney ultrasound by Dr. Signe Colt in 04/2018 with benign workup, urology did cysto which was negative. Has since spontaneously resolved, monitoring annually at this office.   Has been having some low back pain and pain in heels. States pain is worst in the morning and gets better as the day progresses.  Occasional Ibuprofen which relieves slightly. She reported polyarthralgia, symmetrical, bil knees, hips, hands, in the last 1-2 years with AM stiffness, some fatigue, with ? History of RA in MGM, recalls significant deviation deformities in hands.  Has had sed, CRP, RF, anti-DNA, anti CCP, anti smith; had ANA + 1:80 with reassuring Dense fine speckled pattern. Exam essentially normal. Continues to have joint pains.   BMI is There is no height or weight on file to calculate BMI., she has been working on diet and exercise,  just maintaining in the last year, goal is 117 lb.She had not been exercising as much due to fatigue from insomnia  but has improved with HRT.  Admits diet could be better She drinks 3 bottles of water, also sugar free beverages/flavored water Coffee - 16 oz/day Wt Readings from Last 3 Encounters:  09/04/22 140 lb 9.6 oz (63.8 kg)  01/03/22 134 lb (60.8 kg)  12/20/21 134 lb (60.8 kg)   Her blood pressure today is    BP Readings from  Last 3 Encounters:  09/04/22 120/80  01/03/22 (!) 121/52  09/04/21 130/82  She does workout. She denies chest pain, shortness of breath, dizziness.   She is not on cholesterol medication and denies myalgias. Her cholesterol is not at goal. She does limit red meat, dairy and eggs. The cholesterol last visit was:   Lab Results  Component Value Date   CHOL 200 (H) 09/04/2022   HDL 75 09/04/2022   LDLCALC 100 (H) 09/04/2022   TRIG 149 09/04/2022   CHOLHDL 2.7 09/04/2022   She has been working on diet and exercise for history of insulin resistance. Last A1C in the office was:  Lab Results  Component Value Date   HGBA1C 5.4 09/04/2022   Last GFR: Lab Results  Component Value Date   EGFR 84 09/04/2022    Patient is on Vitamin D supplement (2000 IU daily) and at goal:   Lab Results  Component Value Date   VD25OH 75 09/04/2022       Current Medications:  Current Outpatient Medications on File Prior to Visit  Medication Sig Dispense Refill   cetirizine (ZYRTEC) 10 MG tablet Take 10 mg by mouth daily. OTC     Cholecalciferol (VITAMIN D PO) Take 2,000 Int'l Units by mouth daily.      clobetasol (TEMOVATE) 0.05 % external solution Apply topically 2 (two) times daily as needed.     estradiol (CLIMARA - DOSED IN MG/24 HR) 0.05 mg/24hr patch Place 0.05 mg onto the skin once a week.     ondansetron (ZOFRAN) 4 MG tablet Take 1 tablet (4 mg total) by mouth as directed. Take one Zofran pill 30-60 minutes before each colonoscopy prep dose 2 tablet 0   progesterone (PROMETRIUM) 100 MG capsule Take 100 mg by mouth at bedtime.     TURMERIC PO Take 1 tablet by mouth daily.     vitamin C (ASCORBIC ACID) 500 MG tablet Take 500 mg by mouth daily.     No current facility-administered medications on file prior to visit.   Allergies:  No Known Allergies Medical History:  She has Insulin resistance, Hx; Vitamin D deficiency; Medication management; Environmental allergies; BMI 25.0-25.9,adult;  Hyperlipidemia; and Polyarthralgia on their problem list. Health Maintenance:   Immunization History  Administered Date(s) Administered   Influenza Inj Mdck Quad With Preservative 06/23/2019, 09/01/2020   Influenza-Unspecified 08/01/2016, 08/07/2017, 08/06/2018, 07/09/2021   PFIZER(Purple Top)SARS-COV-2 Vaccination 01/05/2020, 01/25/2020, 07/09/2021   PPD Test 07/05/2014, 07/07/2015, 08/06/2016   Pneumococcal Polysaccharide-23 06/01/2010   Td 10/22/2004   Tdap 07/07/2015    Tetanus: 2016 Flu vaccine: 07/09/21 plans to get at [facility] Shingrix: will check with insurance about shingrix  Covid 19: 2/2, 2021, pfizer  LMP: Patient's last menstrual period was 08/20/2017. Pelvic/Pap: 06/2021 - by GYN Dr. Dareen Piano MGM: 06/2022  by GYN DEXA: n/a  Colonoscopy: Dr. Adela Lank -12/2021 repeat colonoscopy in 3 years for surveillance  EGD: n/a  Last Dental Exam: Dr. Yehuda Mao, 2022, q 6 months Last Eye Exam: Dr. Joseph Art, 07/2021, wears contacts, no issues, goes annually Last Derm Exam: Dr. Hulda Humphrey, goes q12 months, no concerning lesions  Patient  Care Team: Lucky Cowboy, MD as PCP - General (Internal Medicine) Joneen Caraway, PA-C as Physician Assistant (Internal Medicine) Jerilee Field, MD as Consulting Physician (Urology)  Surgical History:  She has a past surgical history that includes Anterior cruciate ligament repair (Left, 02/2017); Wisdom tooth extraction; Arthroscopic repair ACL (Left, 02/2017); Colonoscopy (04/28/2021); and Polypectomy. Family History:  Herfamily history includes Breast cancer (age of onset: 56) in her maternal aunt; Breast cancer (age of onset: 33) in her maternal grandmother; Diabetes type II in her maternal grandfather; Heart disease in her maternal grandfather and maternal grandmother; Hypertension in her mother. Social History:  She reports that she has never smoked. She has never used smokeless tobacco. She reports that she does not drink alcohol and does  not use drugs.   Review of Systems: Review of Systems  Constitutional:  Negative for chills, fever, malaise/fatigue and weight loss.  HENT:  Negative for congestion, hearing loss, sinus pain, sore throat and tinnitus.   Eyes:  Negative for blurred vision and double vision.  Respiratory:  Negative for cough, hemoptysis, sputum production, shortness of breath and wheezing.   Cardiovascular:  Negative for chest pain, palpitations, orthopnea, claudication and leg swelling.  Gastrointestinal:  Negative for abdominal pain, blood in stool, constipation, diarrhea, heartburn, melena, nausea and vomiting.  Genitourinary: Negative.  Negative for dysuria and urgency.       Hot Flashes- controlled with new HRT  Musculoskeletal:  Positive for joint pain (overall joint pain). Negative for back pain, falls, myalgias and neck pain.  Skin:  Negative for rash.  Neurological:  Negative for dizziness, tingling, tremors, sensory change, weakness and headaches.  Endo/Heme/Allergies:  Negative for polydipsia. Does not bruise/bleed easily.  Psychiatric/Behavioral:  Negative for depression, substance abuse and suicidal ideas. The patient has insomnia. The patient is not nervous/anxious.   All other systems reviewed and are negative.   Physical Exam: Estimated body mass index is 28.4 kg/m as calculated from the following:   Height as of 09/04/22: 4\' 11"  (1.499 m).   Weight as of 09/04/22: 140 lb 9.6 oz (63.8 kg). LMP 08/20/2017  General Appearance: Well nourished, in no apparent distress.  Eyes: PERRLA, EOMs, conjunctiva no swelling or erythema  Sinuses: No Frontal/maxillary tenderness  ENT/Mouth: Ext aud canals clear, normal light reflex with TMs without erythema, bulging. Good dentition. No erythema, swelling, or exudate on post pharynx. Tonsils not swollen or erythematous. Hearing normal.  Neck: Supple, thyroid normal. No bruits  Respiratory: Respiratory effort normal, BS equal bilaterally without rales,  rhonchi, wheezing or stridor.  Cardio: RRR without murmurs, rubs or gallops. Brisk peripheral pulses without edema.  Chest: symmetric, with normal excursions and percussion.  Breasts: defer to GYN Abdomen: Soft, nontender, no guarding, rebound, hernias, masses, or organomegaly.  Lymphatics: Non tender without lymphadenopathy.  Genitourinary: defer to GYN Musculoskeletal: Full ROM all peripheral extremities,5/5 strength, and normal gait. No obvious deformity, no effusions, heat, laxity.  Skin: Warm, dry without rashes, lesions, ecchymosis. Neuro: Cranial nerves intact, reflexes equal bilaterally. Normal muscle tone, no cerebellar symptoms. Sensation intact.  Psych: Awake and oriented X 3, normal affect, Insight and Judgment appropriate.   EKG : NSR, IRBBB, no ST changes AAA: < 3 cm  Charli Halle E  12:20 PM [facility]

## 2023-09-05 ENCOUNTER — Ambulatory Visit (INDEPENDENT_AMBULATORY_CARE_PROVIDER_SITE_OTHER): Payer: 59 | Admitting: Nurse Practitioner

## 2023-09-05 ENCOUNTER — Encounter: Payer: Self-pay | Admitting: Nurse Practitioner

## 2023-09-05 VITALS — BP 122/74 | HR 76 | Temp 97.5°F | Ht 59.5 in | Wt 138.6 lb

## 2023-09-05 DIAGNOSIS — E782 Mixed hyperlipidemia: Secondary | ICD-10-CM | POA: Diagnosis not present

## 2023-09-05 DIAGNOSIS — Z0001 Encounter for general adult medical examination with abnormal findings: Secondary | ICD-10-CM

## 2023-09-05 DIAGNOSIS — R7309 Other abnormal glucose: Secondary | ICD-10-CM

## 2023-09-05 DIAGNOSIS — Z1389 Encounter for screening for other disorder: Secondary | ICD-10-CM | POA: Diagnosis not present

## 2023-09-05 DIAGNOSIS — Z6827 Body mass index (BMI) 27.0-27.9, adult: Secondary | ICD-10-CM

## 2023-09-05 DIAGNOSIS — Z79899 Other long term (current) drug therapy: Secondary | ICD-10-CM

## 2023-09-05 DIAGNOSIS — Z136 Encounter for screening for cardiovascular disorders: Secondary | ICD-10-CM | POA: Diagnosis not present

## 2023-09-05 DIAGNOSIS — E559 Vitamin D deficiency, unspecified: Secondary | ICD-10-CM | POA: Diagnosis not present

## 2023-09-05 DIAGNOSIS — E663 Overweight: Secondary | ICD-10-CM

## 2023-09-05 DIAGNOSIS — I700 Atherosclerosis of aorta: Secondary | ICD-10-CM

## 2023-09-05 DIAGNOSIS — Z0000 Encounter for general adult medical examination without abnormal findings: Secondary | ICD-10-CM | POA: Diagnosis not present

## 2023-09-05 DIAGNOSIS — Z1329 Encounter for screening for other suspected endocrine disorder: Secondary | ICD-10-CM

## 2023-09-05 DIAGNOSIS — I10 Essential (primary) hypertension: Secondary | ICD-10-CM

## 2023-09-05 NOTE — Patient Instructions (Addendum)
Fair life protein shakes Eat more frequently - try not to go more than 6 hours without protein Aim for 90 grams of protein a day- 30 breakfast/30 lunch 30 dinner Try to keep net carbs less than 50 Net Carbs=Total Carbs-fiber- sugar alcohols Exercise heartrate 120-140(fat burning zone)- walking 20-30 minutes 4 days a week   GENERAL HEALTH GOALS   Know what a healthy weight is for you (roughly BMI <25) and aim to maintain this   Aim for 7+ servings of fruits and vegetables daily   70-80+ fluid ounces of water or unsweet tea for healthy kidneys   Limit to max 1 drink of alcohol per day; avoid smoking/tobacco   Limit animal fats in diet for cholesterol and heart health - choose grass fed whenever available   Avoid highly processed foods, and foods high in saturated/trans fats   Aim for low stress - take time to unwind and care for your mental health   Aim for 150 min of moderate intensity exercise weekly for heart health, and weights twice weekly for bone health   Aim for 7-9 hours of sleep daily

## 2023-09-05 NOTE — Progress Notes (Signed)
 Complete Physical  Assessment and Plan:  Tricia Berger was seen today for annual exam.  Diagnoses and all orders for this visit:  Encounter for routine adult health examination with abnormal findings Continue annual GYN follow up, regular derm, vision, dental screenings Mammogram 09/03/23 benign Colonoscopy UTD 01/03/22 and due 2026 with Dr. Adela Lank  Abnormal glucose -     Continue healthy diet/exercise, weight maintenance -     Hemoglobin A1c  Vitamin D deficiency Continue Vit D supplementation to maintain value in therapeutic level of 60-100  -     VITAMIN D 25 Hydroxy (Vit-D Deficiency, Fractures)  Medication management -     CBC with Differential/Platelet -     CMP/GFR -     Magnesium  Overweight BMI 27-27.9 Long discussion about weight loss, diet, and exercise Recommended diet heavy in fruits and veggies and low in animal meats, cheeses, and dairy products, appropriate calorie intake She will try to increase activity  Screening for ischemic heart disease -     EKG 12-Lead  Screening for hematuria or proteinuria Has seen nephro/uro; negative cysto; check urine annually  Routine UA with reflex microscopic Microalbumin/creatinine urine ratio  Hyperlipidemia  Mild, not currently on treatment Continue low cholesterol diet and exercise.  Check lipid panel.  -     TSH -     Lipid panel  Screening for thyroid disorder - TSH  Screening for AAA - U/S ABD Retroperitoneal LTD    Discussed med's effects and SE's. Screening labs and tests as requested with regular follow-up as recommended. Over 40 minutes of exam, counseling, chart review, and complex, high level critical decision making was performed this visit.   Future Appointments  Date Time Provider Department Center  09/07/2024  2:00 PM Raynelle Dick, NP [facility] None    HPI  57 y.o. very healthy married female, presents for a complete physical and follow up for has Insulin resistance, Hx; Vitamin D  deficiency; Medication management; Environmental allergies; BMI 25.0-25.9,adult; Hyperlipidemia; and Polyarthralgia on their problem list.   Married, two grown boys with 11 y/o twin granddaughters (in [place]), Engineer, structural. No concerns today.   She is followed annually by GYN Dr. Dareen Piano at [facility] and performs monthly breast checks. She was having hot flashes but has been started on Estradiol and Progesterone. Last visit was 08/27/23- Pap was negative.   She will have occasional heartburn , controlled with dietary changes.   She was referred to urology/nephrology for concerns of persistent hemoglobinuria,  She had serum workup and kidney ultrasound by Dr. Signe Colt in 04/2018 with benign workup, urology did cysto which was negative. Has since spontaneously resolved, monitoring annually at this office.   Continues to have some low back pain and pain in heels. States pain is worst in the morning and gets better as the day progresses.  Occasional Ibuprofen which relieves slightly. She reported polyarthralgia, symmetrical, bil knees, hips, hands, in the last 1-2 years with AM stiffness, some fatigue, with ? History of RA in MGM, recalls significant deviation deformities in hands.  Has had sed, CRP, RF, anti-DNA, anti CCP, anti smith; had ANA + 1:80 with reassuring Dense fine speckled pattern. Exam essentially normal. Continues to have joint pains.   BMI is Body mass index is 27.53 kg/m., she has been working on diet and exercise,   Admits diet could be better and has not been exercising, trying to get back into walking. She drinks water and diet soda.  Wt Readings from Last 3  Encounters:  09/05/23 138 lb 9.6 oz (62.9 kg)  09/04/22 140 lb 9.6 oz (63.8 kg)  01/03/22 134 lb (60.8 kg)   BP controlled without medication. Her blood pressure today is BP: 122/74  BP Readings from Last 3 Encounters:  09/05/23 122/74  09/04/22 120/80  01/03/22 (!) 121/52  She does workout. She denies chest  pain, shortness of breath, dizziness.   She is not on cholesterol medication . Her cholesterol is not at goal. She does limit red meat, dairy and eggs, fried foods. The cholesterol last visit was:   Lab Results  Component Value Date   CHOL 200 (H) 09/04/2022   HDL 75 09/04/2022   LDLCALC 100 (H) 09/04/2022   TRIG 149 09/04/2022   CHOLHDL 2.7 09/04/2022   She has been working on diet and exercise for history of insulin resistance/abnormal glucose. Last A1C in the office was:  Lab Results  Component Value Date   HGBA1C 5.4 09/04/2022   Last GFR: Lab Results  Component Value Date   EGFR 84 09/04/2022    Patient is on Vitamin D supplement (2000 IU daily) and at goal:   Lab Results  Component Value Date   VD25OH 75 09/04/2022       Current Medications:  Current Outpatient Medications on File Prior to Visit  Medication Sig Dispense Refill   cetirizine (ZYRTEC) 10 MG tablet Take 10 mg by mouth daily. OTC     Cholecalciferol (VITAMIN D PO) Take 2,000 Int'l Units by mouth daily.      clobetasol (TEMOVATE) 0.05 % external solution Apply topically 2 (two) times daily as needed.     estradiol (CLIMARA - DOSED IN MG/24 HR) 0.05 mg/24hr patch Place 0.05 mg onto the skin once a week.     Multiple Vitamin (MULTIVITAMIN) tablet Take 1 tablet by mouth daily.     ondansetron (ZOFRAN) 4 MG tablet Take 1 tablet (4 mg total) by mouth as directed. Take one Zofran pill 30-60 minutes before each colonoscopy prep dose 2 tablet 0   progesterone (PROMETRIUM) 100 MG capsule Take 100 mg by mouth at bedtime.     TURMERIC PO Take 1 tablet by mouth daily.     No current facility-administered medications on file prior to visit.   Allergies:  No Known Allergies Medical History:  She has Insulin resistance, Hx; Vitamin D deficiency; Medication management; Environmental allergies; BMI 25.0-25.9,adult; Hyperlipidemia; and Polyarthralgia on their problem list. Health Maintenance:   Immunization History   Administered Date(s) Administered   Influenza Inj Mdck Quad With Preservative 06/23/2019, 09/01/2020   Influenza-Unspecified 08/01/2016, 08/07/2017, 08/06/2018, 07/09/2021   PFIZER(Purple Top)SARS-COV-2 Vaccination 01/05/2020, 01/25/2020, 07/09/2021   PPD Test 07/05/2014, 07/07/2015, 08/06/2016   Pneumococcal Polysaccharide-23 06/01/2010   Td 10/22/2004   Tdap 07/07/2015   Health Maintenance  Topic Date Due   Zoster Vaccines- Shingrix (1 of 2) Never done   Cervical Cancer Screening (HPV/Pap Cotest)  Never done   INFLUENZA VACCINE  05/23/2023   COVID-19 Vaccine (4 - 2023-24 season) 06/23/2023   Colonoscopy  01/03/2025   DTaP/Tdap/Td (3 - Td or Tdap) 07/06/2025   MAMMOGRAM  09/02/2025   HIV Screening  Completed   HPV VACCINES  Aged Out   Hepatitis C Screening  Discontinued   Colonoscopy: Dr. Adela Lank -12/2021 repeat colonoscopy in 3 years for surveillance    Last Dental Exam: Dr. Yehuda Mao, 10/24 Last Eye Exam: Dr. Joseph Art, due 09/2023 Last Derm Exam: Dr. Hulda Humphrey, g2024  Patient Care Team: Lucky Cowboy, MD as  PCP - General (Internal Medicine) Joneen Caraway, PA-C as Physician Assistant (Internal Medicine) Jerilee Field, MD as Consulting Physician (Urology)  Surgical History:  She has a past surgical history that includes Anterior cruciate ligament repair (Left, 02/2017); Wisdom tooth extraction; Arthroscopic repair ACL (Left, 02/2017); Colonoscopy (04/28/2021); and Polypectomy. Family History:  Herfamily history includes Breast cancer (age of onset: 61) in her maternal aunt; Breast cancer (age of onset: 60) in her maternal grandmother; Diabetes type II in her maternal grandfather; Heart disease in her maternal grandfather and maternal grandmother; Hypertension in her mother. Social History:  She reports that she has never smoked. She has never used smokeless tobacco. She reports that she does not drink alcohol and does not use drugs.   Review of  Systems: Review of Systems  Constitutional:  Negative for chills, fever, malaise/fatigue and weight loss.  HENT:  Negative for congestion, hearing loss, sinus pain, sore throat and tinnitus.   Eyes:  Negative for blurred vision and double vision.  Respiratory:  Negative for cough, hemoptysis, sputum production, shortness of breath and wheezing.   Cardiovascular:  Negative for chest pain, palpitations, orthopnea, claudication and leg swelling.  Gastrointestinal:  Negative for abdominal pain, blood in stool, constipation, diarrhea, heartburn, melena, nausea and vomiting.  Genitourinary: Negative.  Negative for dysuria and urgency.  Musculoskeletal:  Positive for joint pain (overall joint pain). Negative for back pain, falls, myalgias and neck pain.  Skin:  Negative for rash.  Neurological:  Negative for dizziness, tingling, tremors, sensory change, weakness and headaches.  Endo/Heme/Allergies:  Negative for polydipsia. Does not bruise/bleed easily.  Psychiatric/Behavioral:  Negative for depression, substance abuse and suicidal ideas. The patient is not nervous/anxious and does not have insomnia.   All other systems reviewed and are negative.   Physical Exam: Estimated body mass index is 27.53 kg/m as calculated from the following:   Height as of this encounter: 4' 11.5" (1.511 m).   Weight as of this encounter: 138 lb 9.6 oz (62.9 kg). BP 122/74   Pulse 76   Temp (!) 97.5 F (36.4 C)   Ht 4' 11.5" (1.511 m)   Wt 138 lb 9.6 oz (62.9 kg)   LMP 08/20/2017   SpO2 96%   BMI 27.53 kg/m  General Appearance: Well nourished, in no apparent distress.  Eyes: PERRLA, EOMs, conjunctiva no swelling or erythema  Sinuses: No Frontal/maxillary tenderness  ENT/Mouth: Ext aud canals clear, normal light reflex with TMs without erythema, bulging. Good dentition. No erythema, swelling, or exudate on post pharynx.  Hearing normal.  Neck: Supple, thyroid normal. No bruits  Respiratory: Respiratory effort  normal, BS equal bilaterally without rales, rhonchi, wheezing or stridor.  Cardio: RRR without murmurs, rubs or gallops. Brisk peripheral pulses without edema.  Chest: symmetric, with normal excursions and percussion.  Breasts: defer to GYN Abdomen: Soft, nontender, no guarding, rebound, hernias, masses, or organomegaly.  Lymphatics: Non tender without lymphadenopathy.  Genitourinary: defer to GYN Musculoskeletal: Full ROM all peripheral extremities,5/5 strength, and normal gait. No obvious deformity, no effusions, heat, laxity.  Skin: Warm, dry without rashes, lesions, ecchymosis. Neuro: Cranial nerves intact, reflexes equal bilaterally. Normal muscle tone, no cerebellar symptoms. Sensation intact.  Psych: Awake and oriented X 3, normal affect, Insight and Judgment appropriate.   EKG : NSR, IRBBB, no ST changes AAA: < 3 cm  Brielyn Bosak E  3:32 PM [facility]

## 2023-09-06 ENCOUNTER — Encounter: Payer: Self-pay | Admitting: Nurse Practitioner

## 2023-09-06 ENCOUNTER — Other Ambulatory Visit: Payer: Self-pay | Admitting: Nurse Practitioner

## 2023-09-06 DIAGNOSIS — E782 Mixed hyperlipidemia: Secondary | ICD-10-CM

## 2023-09-06 LAB — COMPLETE METABOLIC PANEL WITH GFR
AG Ratio: 1.5 (calc) (ref 1.0–2.5)
ALT: 24 U/L (ref 6–29)
AST: 26 U/L (ref 10–35)
Albumin: 4.7 g/dL (ref 3.6–5.1)
Alkaline phosphatase (APISO): 70 U/L (ref 37–153)
BUN: 17 mg/dL (ref 7–25)
CO2: 27 mmol/L (ref 20–32)
Calcium: 10.1 mg/dL (ref 8.6–10.4)
Chloride: 102 mmol/L (ref 98–110)
Creat: 0.72 mg/dL (ref 0.50–1.03)
Globulin: 3.1 g/dL (ref 1.9–3.7)
Glucose, Bld: 78 mg/dL (ref 65–99)
Potassium: 4.5 mmol/L (ref 3.5–5.3)
Sodium: 140 mmol/L (ref 135–146)
Total Bilirubin: 0.4 mg/dL (ref 0.2–1.2)
Total Protein: 7.8 g/dL (ref 6.1–8.1)
eGFR: 99 mL/min/{1.73_m2} (ref >=60)

## 2023-09-06 LAB — CBC WITH DIFFERENTIAL/PLATELET
Absolute Lymphocytes: 3213 {cells}/uL (ref 850–3900)
Absolute Monocytes: 641 {cells}/uL (ref 200–950)
Basophils Absolute: 74 {cells}/uL (ref 0–200)
Basophils Relative: 0.7 %
Eosinophils Absolute: 252 {cells}/uL (ref 15–500)
Eosinophils Relative: 2.4 %
HCT: 42.6 % (ref 35.0–45.0)
Hemoglobin: 14.3 g/dL (ref 11.7–15.5)
MCH: 30.6 pg (ref 27.0–33.0)
MCHC: 33.6 g/dL (ref 32.0–36.0)
MCV: 91.2 fL (ref 80.0–100.0)
MPV: 11 fL (ref 7.5–12.5)
Monocytes Relative: 6.1 %
Neutro Abs: 6321 {cells}/uL (ref 1500–7800)
Neutrophils Relative %: 60.2 %
Platelets: 362 10*3/uL (ref 140–400)
RBC: 4.67 10*6/uL (ref 3.80–5.10)
RDW: 12.6 % (ref 11.0–15.0)
Total Lymphocyte: 30.6 %
WBC: 10.5 10*3/uL (ref 3.8–10.8)

## 2023-09-06 LAB — LIPID PANEL
Cholesterol: 207 mg/dL — ABNORMAL HIGH (ref <200)
HDL: 78 mg/dL (ref >=50)
LDL Cholesterol (Calc): 101 mg/dL — ABNORMAL HIGH
Non-HDL Cholesterol (Calc): 129 mg/dL (ref <130)
Total CHOL/HDL Ratio: 2.7 (calc) (ref <5.0)
Triglycerides: 182 mg/dL — ABNORMAL HIGH (ref <150)

## 2023-09-06 LAB — URINALYSIS, ROUTINE W REFLEX MICROSCOPIC
Bilirubin Urine: NEGATIVE
Glucose, UA: NEGATIVE
Hgb urine dipstick: NEGATIVE
Ketones, ur: NEGATIVE
Leukocytes,Ua: NEGATIVE
Nitrite: NEGATIVE
Protein, ur: NEGATIVE
Specific Gravity, Urine: 1.006 (ref 1.001–1.035)
pH: 6.5 (ref 5.0–8.0)

## 2023-09-06 LAB — VITAMIN D 25 HYDROXY (VIT D DEFICIENCY, FRACTURES): Vit D, 25-Hydroxy: 74 ng/mL (ref 30–100)

## 2023-09-06 LAB — MICROALBUMIN / CREATININE URINE RATIO
Creatinine, Urine: 17 mg/dL — ABNORMAL LOW (ref 20–275)
Microalb, Ur: <0.2 mg/dL

## 2023-09-06 LAB — TSH: TSH: 2.53 m[IU]/L

## 2023-09-06 LAB — MAGNESIUM: Magnesium: 2.1 mg/dL (ref 1.5–2.5)

## 2023-09-06 LAB — HEMOGLOBIN A1C W/OUT EAG: Hgb A1c MFr Bld: 5.1 %{Hb} (ref <5.7)

## 2023-09-06 MED ORDER — ROSUVASTATIN CALCIUM 5 MG PO TABS: ORAL_TABLET | ORAL | 3 refills | Status: DC

## 2023-12-05 ENCOUNTER — Other Ambulatory Visit: Payer: Self-pay

## 2023-12-05 DIAGNOSIS — E782 Mixed hyperlipidemia: Secondary | ICD-10-CM

## 2023-12-05 MED ORDER — ROSUVASTATIN CALCIUM 5 MG PO TABS: ORAL_TABLET | ORAL | 3 refills | Status: DC

## 2024-01-31 ENCOUNTER — Ambulatory Visit: Payer: No Typology Code available for payment source | Admitting: Internal Medicine

## 2024-01-31 ENCOUNTER — Other Ambulatory Visit: Payer: Self-pay | Admitting: Internal Medicine

## 2024-01-31 ENCOUNTER — Encounter: Payer: Self-pay | Admitting: Internal Medicine

## 2024-01-31 VITALS — BP 110/72 | HR 77 | Temp 97.2°F | Ht 59.5 in | Wt 134.6 lb

## 2024-01-31 DIAGNOSIS — Z79890 Hormone replacement therapy (postmenopausal): Secondary | ICD-10-CM

## 2024-01-31 DIAGNOSIS — E65 Localized adiposity: Secondary | ICD-10-CM

## 2024-01-31 DIAGNOSIS — N951 Menopausal and female climacteric states: Secondary | ICD-10-CM

## 2024-01-31 DIAGNOSIS — E781 Pure hyperglyceridemia: Secondary | ICD-10-CM

## 2024-01-31 DIAGNOSIS — E785 Hyperlipidemia, unspecified: Secondary | ICD-10-CM

## 2024-01-31 DIAGNOSIS — M2550 Pain in unspecified joint: Secondary | ICD-10-CM

## 2024-01-31 DIAGNOSIS — E559 Vitamin D deficiency, unspecified: Secondary | ICD-10-CM

## 2024-01-31 DIAGNOSIS — E88819 Insulin resistance, unspecified: Secondary | ICD-10-CM | POA: Diagnosis not present

## 2024-01-31 DIAGNOSIS — Z5181 Encounter for therapeutic drug level monitoring: Secondary | ICD-10-CM

## 2024-01-31 DIAGNOSIS — N952 Postmenopausal atrophic vaginitis: Secondary | ICD-10-CM

## 2024-01-31 DIAGNOSIS — Z823 Family history of stroke: Secondary | ICD-10-CM

## 2024-01-31 MED ORDER — OMEGA-3-ACID ETHYL ESTERS 1 G PO CAPS: 2.0000 g | ORAL_CAPSULE | Freq: Two times a day (BID) | ORAL | 3 refills | Status: AC

## 2024-01-31 MED ORDER — ESTRADIOL 0.1 MG/GM VA CREA: 1.0000 | TOPICAL_CREAM | Freq: Every day | VAGINAL | 12 refills | Status: DC

## 2024-01-31 MED ORDER — VEOZAH 45 MG PO TABS: 1.0000 | ORAL_TABLET | Freq: Every day | ORAL | 11 refills | Status: DC

## 2024-01-31 MED ORDER — ROSUVASTATIN CALCIUM 20 MG PO TABS: 20.0000 mg | ORAL_TABLET | Freq: Every day | ORAL | 3 refills | Status: AC

## 2024-01-31 NOTE — Assessment & Plan Note (Signed)
 She has gestational diabetes and abdominal adiposity, indicating mild insulin resistance. Weight management was discussed as a strategy to improve insulin sensitivity. The challenges of weight loss were acknowledged, and the potential use of weight loss medications was discussed, though current insurance coverage is limited. Encourage weight loss through lifestyle modifications. Discuss potential use of weight loss medications if lifestyle changes are insufficient.

## 2024-01-31 NOTE — Assessment & Plan Note (Signed)
 She has vitamin D deficiency and is currently taking 1000 IU daily. This is important for bone health, especially if transitioning off estrogen therapy. Continue vitamin D supplementation at 1000 IU daily.

## 2024-01-31 NOTE — Assessment & Plan Note (Signed)
 She experiences morning stiffness and joint pain, which improves throughout the day. This may be related to bone health and estrogen use. Continue vitamin D supplementation.

## 2024-01-31 NOTE — Assessment & Plan Note (Signed)
 She has been on rosuvastatin 5 mg three times a week since November due to borderline cholesterol levels. Her HDL is exceptionally high, and her overall risk of heart disease is low. However, due to a family history of stroke and her use of estrogen replacement therapy, continuing rosuvastatin is recommended to mitigate stroke risk. She is not experiencing any side effects from rosuvastatin. The decision to continue rosuvastatin is supported by the fact that her ten-year risk of stroke is only 1%, and guidelines typically recommend rosuvastatin at a 7.5% risk. However, given her estrogen use and family history, rosuvastatin is advised to prevent artery clogging. Prescribe rosuvastatin 20 mg with instructions to titrate dose based on tolerance. Encourage consumption of avocados and extra virgin olive oil for cholesterol management. Consider fish oil supplementation if covered by insurance.

## 2024-01-31 NOTE — Progress Notes (Signed)
 [facility]  Phone: [phone]  - Medical Office Visit -  Visit Date: 01/31/2024 Patient: Tricia Berger   DOB: 1966/05/27   58 y.o. Female  MRN: [id number] Patient Care Team: Lula Olszewski, MD as PCP - General (Internal Medicine) Joneen Caraway, PA-C as Physician Assistant (Internal Medicine) Jerilee Field, MD as Consulting Physician (Urology) Today's Health Care Provider: Lula Olszewski, MD  ===========================================   Chief Complaint / Reason for Visit: new pt (Pt is present to est care with PCP pt would like to discuss Rosuvastatin she takes it three times week.)  Background: 58 y.o. female who has Insulin resistance, Hx; Vitamin D deficiency; Environmental allergies; BMI 25.0-25.9,adult; Hyperlipidemia; Polyarthralgia; and Central adiposity on their problem list. History of Present Illness 58 year old female who presents for management of her cholesterol and discussion of estrogen replacement therapy.  She started taking rosuvastatin three times a week in November 2024 due to borderline and increasing cholesterol levels. She has not undergone blood work since initiating the medication. She experiences no side effects from rosuvastatin, but had severe cramps when she accidentally took her husband's atorvastatin.  She is currently on estrogen replacement therapy using the smallest dosage patch. She experiences joint pain, particularly in the mornings, which has improved since starting estrogen therapy. She has a family history of strokes, with her grandparents having had strokes, and is concerned about the associated stroke risk with estrogen use.  She has a history of gestational diabetes during her second pregnancy and currently has minimal insulin resistance. She wants to lose weight, having previously succeeded with Weight Watchers and increased walking, but regained weight during the COVID-19 pandemic due to comfort eating and  working from home. She works from home four days a week, contributing to less physical activity.  She is planning a trip to [place] in August 2025 and is concerned about the risk of blood clots during long flights. She is considering strategies to mitigate this risk, such as leg exercises and possibly taking aspirin.   Medications updated/reviewed: Current Outpatient Medications on File Prior to Visit  Medication Sig   cetirizine (ZYRTEC) 10 MG tablet Take 10 mg by mouth daily. OTC   Cholecalciferol (VITAMIN D PO) Take 2,000 Int'l Units by mouth daily.    estradiol (CLIMARA - DOSED IN MG/24 HR) 0.05 mg/24hr patch Place 0.05 mg onto the skin once a week.   Multiple Vitamin (MULTIVITAMIN) tablet Take 1 tablet by mouth daily.   progesterone (PROMETRIUM) 100 MG capsule Take 100 mg by mouth at bedtime.   TURMERIC PO Take 1 tablet by mouth daily.   clobetasol (TEMOVATE) 0.05 % external solution Apply topically 2 (two) times daily as needed. (Patient not taking: Reported on 01/31/2024)   ondansetron (ZOFRAN) 4 MG tablet Take 1 tablet (4 mg total) by mouth as directed. Take one Zofran pill 30-60 minutes before each colonoscopy prep dose   No current facility-administered medications on file prior to visit.   Medications Discontinued During This Encounter  Medication Reason   rosuvastatin (CRESTOR) 5 MG tablet Dose change   Current Meds  Medication Sig   cetirizine (ZYRTEC) 10 MG tablet Take 10 mg by mouth daily. OTC   Cholecalciferol (VITAMIN D PO) Take 2,000 Int'l Units by mouth daily.    estradiol (CLIMARA - DOSED IN MG/24 HR) 0.05 mg/24hr patch Place 0.05 mg onto the skin once a week.   estradiol (ESTRACE) 0.1 MG/GM vaginal cream Place 1 Applicatorful vaginally at bedtime.  Fezolinetant (VEOZAH) 45 MG TABS Take 1 tablet (45 mg total) by mouth daily at 6 (six) AM.   Multiple Vitamin (MULTIVITAMIN) tablet Take 1 tablet by mouth daily.   omega-3 acid ethyl esters (LOVAZA) 1 g capsule Take 2  capsules (2 g total) by mouth 2 (two) times daily.   progesterone (PROMETRIUM) 100 MG capsule Take 100 mg by mouth at bedtime.   rosuvastatin (CRESTOR) 20 MG tablet Take 1 tablet (20 mg total) by mouth daily.   TURMERIC PO Take 1 tablet by mouth daily.   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 Past Surgical History:   has a past surgical history that includes Anterior cruciate ligament repair (Left, 02/2017); Wisdom tooth extraction; Arthroscopic repair ACL (Left, 02/2017); Colonoscopy (04/28/2021); and Polypectomy. Social History:   reports that she has never smoked. She has never used smokeless tobacco. She reports current alcohol use. She reports that she does not use drugs. Family History:  family history includes Breast cancer (age of onset: 54) in her maternal aunt; Breast cancer (age of onset: 56) in her maternal grandmother; Diabetes type II in her maternal grandfather; Heart disease in her maternal grandfather and maternal grandmother; Hypertension in her mother. Depression Screen and Health Maintenance:    01/31/2024   10:33 AM 09/01/2020    3:17 PM 09/02/2019    3:36 PM 08/28/2018    3:18 PM  PHQ 2/9 Scores  PHQ - 2 Score 0 0 2 0  PHQ- 9 Score 0  7    Health Maintenance  Topic Date Due   Zoster Vaccines- Shingrix (1 of 2) Never done   COVID-19 Vaccine (6 - 2024-25 season) 02/16/2024 (Originally 06/23/2023)   Cervical Cancer Screening (HPV/Pap Cotest)  01/30/2025 (Originally 11/03/1997)   INFLUENZA VACCINE  05/22/2024   Colonoscopy  01/03/2025   DTaP/Tdap/Td (3 - Td or Tdap) 07/06/2025   MAMMOGRAM  09/02/2025   HIV Screening  Completed    Pneumococcal Vaccine 61-23 Years old  Aged Out   HPV VACCINES  Aged Out   Meningococcal B Vaccine  Aged Out   Hepatitis C Screening  Discontinued   Immunization History  Administered Date(s) Administered   Influenza Inj Mdck Quad With Preservative 06/23/2019, 09/01/2020   Influenza-Unspecified 08/01/2016, 08/07/2017, 08/06/2018, 07/09/2021   PFIZER(Purple Top)SARS-COV-2 Vaccination 01/05/2020, 01/25/2020, 07/09/2021   PPD Test 07/05/2014, 07/07/2015, 08/06/2016   Pneumococcal Polysaccharide-23 06/01/2010   Td 10/22/2004   Tdap 07/07/2015     Objective   Physical ExamBP 110/72   Pulse 77   Temp (!) 97.2 F (36.2 C) (Temporal)   Ht 4' 11.5" (1.511 m)   Wt 134 lb 9.6 oz (61.1 kg)   LMP 08/20/2017   SpO2 98%   BMI 26.73 kg/m  Wt Readings from Last 10 Encounters:  01/31/24 134 lb 9.6 oz (61.1 kg)  09/05/23 138 lb 9.6 oz (62.9 kg)  09/04/22 140 lb 9.6 oz (63.8 kg)  01/03/22 134 lb (60.8 kg)  12/20/21 134 lb (60.8 kg)  09/04/21 136 lb (61.7 kg)  04/28/21 131 lb (59.4 kg)  04/14/21 131 lb (59.4 kg)  09/01/20 129 lb 9.6 oz (58.8 kg)  02/18/20 130 lb (59 kg)  Vital signs reviewed.  Nursing notes reviewed. Weight trend reviewed. Abnormalities and problem-specific physical exam findings:   General Appearance:  Well developed, well nourished, well-groomed, healthy-appearing female with Body mass index is 26.73 kg/m. No acute distress appreciable.  Mild truncal adiposity  Skin: Clear and well-hydrated. Pulmonary:  Normal work of breathing at rest, no respiratory distress apparent. SpO2: 98 %  Musculoskeletal: She demonstrates smooth and coordinated movements throughout all major joints.All extremities are intact.  Neurological:  Awake, alert, oriented, and engaged.  No obvious focal neurological deficits or cognitive impairments.  Sensorium seems unclouded.  Psychiatric:  Appropriate mood, pleasant and cooperative demeanor, cheerful and engaged during the exam Results LABS HDL:  elevated (08/2023) Cholesterol: borderline (08/2023) Vitamin D: deficient Last CBC Lab Results  Component Value Date   WBC 10.5 09/05/2023   HGB 14.3 09/05/2023   HCT 42.6 09/05/2023   MCV 91.2 09/05/2023   MCH 30.6 09/05/2023   RDW 12.6 09/05/2023   PLT 362 09/05/2023   Last metabolic panel Lab Results  Component Value Date   GLUCOSE 78 09/05/2023   NA 140 09/05/2023   K 4.5 09/05/2023   CL 102 09/05/2023   CO2 27 09/05/2023   BUN 17 09/05/2023   CREATININE 0.72 09/05/2023   EGFR 99 09/05/2023   CALCIUM 10.1 09/05/2023   PROT 7.8 09/05/2023   ALBUMIN 4.3 08/06/2016   BILITOT 0.4 09/05/2023   ALKPHOS 61 08/06/2016   AST 26 09/05/2023   ALT 24 09/05/2023   Last lipids Lab Results  Component Value Date   CHOL 207 (H) 09/05/2023   HDL 78 09/05/2023   LDLCALC 101 (H) 09/05/2023   TRIG 182 (H) 09/05/2023   CHOLHDL 2.7 09/05/2023   Last hemoglobin A1c Lab Results  Component Value Date   HGBA1C 5.1 09/05/2023    No results found for any visits on 01/31/24.  Office Visit on 09/05/2023  Component Date Value   WBC 09/05/2023 10.5    RBC 09/05/2023 4.67    Hemoglobin 09/05/2023 14.3    HCT 09/05/2023 42.6    MCV 09/05/2023 91.2    MCH 09/05/2023 30.6    MCHC 09/05/2023 33.6    RDW 09/05/2023 12.6    Platelets 09/05/2023 362    MPV 09/05/2023 11.0    Neutro Abs 09/05/2023 6,321    Absolute Lymphocytes 09/05/2023 3,213    Absolute Monocytes 09/05/2023 641    Eosinophils Absolute 09/05/2023 252    Basophils Absolute 09/05/2023 74    Neutrophils Relative % 09/05/2023 60.2    Total Lymphocyte 09/05/2023 30.6    Monocytes Relative 09/05/2023 6.1    Eosinophils Relative 09/05/2023 2.4    Basophils Relative 09/05/2023 0.7    Glucose, Bld 09/05/2023 78    BUN 09/05/2023 17    Creat 09/05/2023 0.72    eGFR 09/05/2023 99    BUN/Creatinine Ratio 09/05/2023 SEE NOTE:    Sodium 09/05/2023 140    Potassium 09/05/2023 4.5    Chloride 09/05/2023 102    CO2 09/05/2023  27    Calcium 09/05/2023 10.1    Total Protein 09/05/2023 7.8    Albumin 09/05/2023 4.7    Globulin 09/05/2023 3.1    AG Ratio 09/05/2023 1.5    Total Bilirubin 09/05/2023 0.4    Alkaline phosphatase (AP* 09/05/2023 70    AST 09/05/2023 26    ALT 09/05/2023 24    Magnesium 09/05/2023 2.1    Cholesterol 09/05/2023 207 (H)    HDL 09/05/2023 78    Triglycerides 09/05/2023 182 (H)  LDL Cholesterol (Calc) 09/05/2023 101 (H)    Total CHOL/HDL Ratio 09/05/2023 2.7    Non-HDL Cholesterol (Cal* 09/05/2023 129    TSH 09/05/2023 2.53    Hgb A1c MFr Bld 09/05/2023 5.1    Vit D, 25-Hydroxy 09/05/2023 74    Color, Urine 09/05/2023 YELLOW    APPearance 09/05/2023 CLEAR    Specific Gravity, Urine 09/05/2023 1.006    pH 09/05/2023 6.5    Glucose, UA 09/05/2023 NEGATIVE    Bilirubin Urine 09/05/2023 NEGATIVE    Ketones, ur 09/05/2023 NEGATIVE    Hgb urine dipstick 09/05/2023 NEGATIVE    Protein, ur 09/05/2023 NEGATIVE    Nitrite 09/05/2023 NEGATIVE    Leukocytes,Ua 09/05/2023 NEGATIVE    Creatinine, Urine 09/05/2023 17 (L)    Microalb, Ur 09/05/2023 <0.2    Microalb Creat Ratio 09/05/2023 NOTE    No image results found.   No results found.  MM 3D DIAGNOSTIC MAMMOGRAM BILATERAL BREAST Result Date: 09/03/2023 CLINICAL DATA:  58 year old female presenting as a recall from screening for possible bilateral breast masses. EXAM: DIGITAL DIAGNOSTIC BILATERAL MAMMOGRAM WITH TOMOSYNTHESIS AND CAD; ULTRASOUND RIGHT BREAST LIMITED; ULTRASOUND LEFT BREAST LIMITED TECHNIQUE: Bilateral digital diagnostic mammography and breast tomosynthesis was performed. The images were evaluated with computer-aided detection. ; Targeted ultrasound examination of the right breast was performed; Targeted ultrasound examination of the left breast was performed. COMPARISON:  Previous exam(s). ACR Breast Density Category b: There are scattered areas of fibroglandular density. FINDINGS: Mammogram: Right breast: Spot compression  tomosynthesis views of the right breast were performed demonstrating a cluster of small masses in the upper outer right breast spanning up to 1.6 cm. Left breast: Spot compression tomosynthesis views of the left breast performed demonstrating persistence of a small oval circumscribed mass in the upper slightly outer left breast. Ultrasound: Right breast: Targeted ultrasound is performed in the right breast at 10 o'clock 2 cm from the nipple demonstrating several clustered tiny anechoic cysts which correspond to the mammographic finding. No suspicious solid mass. Left breast: Targeted ultrasound performed in the left breast at 1 o'clock 2 cm from the nipple demonstrating an oval circumscribed anechoic mass measuring 0.5 cm, consistent with a benign cyst. This corresponds to the mammographic finding. IMPRESSION: Bilateral benign subcentimeter cysts. RECOMMENDATION: Screening mammogram in one year.(Code:SM-B-01Y) I have discussed the findings and recommendations with the patient. If applicable, a reminder letter will be sent to the patient regarding the next appointment. BI-RADS CATEGORY  2: Benign. Electronically Signed   By: Emmaline Kluver M.D.   On: 09/03/2023 12:34   [place] LIMITED ULTRASOUND INCLUDING AXILLA LEFT BREAST  Result Date: 09/03/2023 CLINICAL DATA:  59 year old female presenting as a recall from screening for possible bilateral breast masses. EXAM: DIGITAL DIAGNOSTIC BILATERAL MAMMOGRAM WITH TOMOSYNTHESIS AND CAD; ULTRASOUND RIGHT BREAST LIMITED; ULTRASOUND LEFT BREAST LIMITED TECHNIQUE: Bilateral digital diagnostic mammography and breast tomosynthesis was performed. The images were evaluated with computer-aided detection. ; Targeted ultrasound examination of the right breast was performed; Targeted ultrasound examination of the left breast was performed. COMPARISON:  Previous exam(s). ACR Breast Density Category b: There are scattered areas of fibroglandular density. FINDINGS: Mammogram: Right  breast: Spot compression tomosynthesis views of the right breast were performed demonstrating a cluster of small masses in the upper outer right breast spanning up to 1.6 cm. Left breast: Spot compression tomosynthesis views of the left breast performed demonstrating persistence of a small oval circumscribed mass in the upper slightly outer left breast. Ultrasound: Right breast: Targeted ultrasound is performed in the  right breast at 10 o'clock 2 cm from the nipple demonstrating several clustered tiny anechoic cysts which correspond to the mammographic finding. No suspicious solid mass. Left breast: Targeted ultrasound performed in the left breast at 1 o'clock 2 cm from the nipple demonstrating an oval circumscribed anechoic mass measuring 0.5 cm, consistent with a benign cyst. This corresponds to the mammographic finding. IMPRESSION: Bilateral benign subcentimeter cysts. RECOMMENDATION: Screening mammogram in one year.(Code:SM-B-01Y) I have discussed the findings and recommendations with the patient. If applicable, a reminder letter will be sent to the patient regarding the next appointment. BI-RADS CATEGORY  2: Benign. Electronically Signed   By: Emmaline Kluver M.D.   On: 09/03/2023 12:34   [place] LIMITED ULTRASOUND INCLUDING AXILLA RIGHT BREAST Result Date: 09/03/2023 CLINICAL DATA:  58 year old female presenting as a recall from screening for possible bilateral breast masses. EXAM: DIGITAL DIAGNOSTIC BILATERAL MAMMOGRAM WITH TOMOSYNTHESIS AND CAD; ULTRASOUND RIGHT BREAST LIMITED; ULTRASOUND LEFT BREAST LIMITED TECHNIQUE: Bilateral digital diagnostic mammography and breast tomosynthesis was performed. The images were evaluated with computer-aided detection. ; Targeted ultrasound examination of the right breast was performed; Targeted ultrasound examination of the left breast was performed. COMPARISON:  Previous exam(s). ACR Breast Density Category b: There are scattered areas of fibroglandular density.  FINDINGS: Mammogram: Right breast: Spot compression tomosynthesis views of the right breast were performed demonstrating a cluster of small masses in the upper outer right breast spanning up to 1.6 cm. Left breast: Spot compression tomosynthesis views of the left breast performed demonstrating persistence of a small oval circumscribed mass in the upper slightly outer left breast. Ultrasound: Right breast: Targeted ultrasound is performed in the right breast at 10 o'clock 2 cm from the nipple demonstrating several clustered tiny anechoic cysts which correspond to the mammographic finding. No suspicious solid mass. Left breast: Targeted ultrasound performed in the left breast at 1 o'clock 2 cm from the nipple demonstrating an oval circumscribed anechoic mass measuring 0.5 cm, consistent with a benign cyst. This corresponds to the mammographic finding. IMPRESSION: Bilateral benign subcentimeter cysts. RECOMMENDATION: Screening mammogram in one year.(Code:SM-B-01Y) I have discussed the findings and recommendations with the patient. If applicable, a reminder letter will be sent to the patient regarding the next appointment. BI-RADS CATEGORY  2: Benign. Electronically Signed   By: Emmaline Kluver M.D.   On: 09/03/2023 12:34      Assessment & Plan Hyperlipidemia, unspecified hyperlipidemia type She has been on rosuvastatin 5 mg three times a week since November due to borderline cholesterol levels. Her HDL is exceptionally high, and her overall risk of heart disease is low. However, due to a family history of stroke and her use of estrogen replacement therapy, continuing rosuvastatin is recommended to mitigate stroke risk. She is not experiencing any side effects from rosuvastatin. The decision to continue rosuvastatin is supported by the fact that her ten-year risk of stroke is only 1%, and guidelines typically recommend rosuvastatin at a 7.5% risk. However, given her estrogen use and family history, rosuvastatin  is advised to prevent artery clogging. Prescribe rosuvastatin 20 mg with instructions to titrate dose based on tolerance. Encourage consumption of avocados and extra virgin olive oil for cholesterol management. Consider fish oil supplementation if covered by insurance. Hot flash, menopausal Try to get Veozah to reduce stroke risk Hypertriglyceridemia Try to get lovaza Insulin resistance, Hx She has gestational diabetes and abdominal adiposity, indicating mild insulin resistance. Weight management was discussed as a strategy to improve insulin sensitivity. The challenges of weight loss were acknowledged,  and the potential use of weight loss medications was discussed, though current insurance coverage is limited. Encourage weight loss through lifestyle modifications. Discuss potential use of weight loss medications if lifestyle changes are insufficient. Central adiposity Encouraged weight loss  Discussed local options GLP-1 agonist not covered, she doesn't qualify for bariatrics, she  Vitamin D deficiency She has vitamin D deficiency and is currently taking 1000 IU daily. This is important for bone health, especially if transitioning off estrogen therapy. Continue vitamin D supplementation at 1000 IU daily. Postmenopausal atrophic vaginitis Try estrace instead of estrogen to prevent this while lowering stroke risk. Encounter for monitoring estrogen replacement therapy following surgical menopause She is on a low-dose estradiol patch for menopausal symptoms. Given her family history of stroke and the risks associated with estrogen therapy, transitioning off estrogen is advised. Alternative treatments for hot flashes and vaginal health were discussed, including Veozah and vaginal estrogen cream. The risks of estrogen therapy include increased stroke risk, particularly with long flights, and the potential for blood clots. The benefits of transitioning to [facility] and vaginal cream include reduced stroke and  breast cancer risks, though she would lose the bone protection effect of estrogen. Prescribe Veozah for hot flashes and vaginal estrogen cream. Consider discontinuing estradiol patch, especially before long flights. Recommend daily aspirin if continuing estrogen therapy. Polyarthralgia She experiences morning stiffness and joint pain, which improves throughout the day. This may be related to bone health and estrogen use. Continue vitamin D supplementation. FH: stroke She is advised to maintain a healthy lifestyle to prevent cardiovascular and cancer risks. Emphasis was placed on dietary choices, including the use of extra virgin olive oil and coffee for their protective effects. The benefits of extra virgin olive oil in reducing heart attacks and strokes were highlighted, as well as the antioxidant properties of coffee in cancer prevention. Encourage consumption of extra virgin olive oil and coffee. Advise on cancer screenings, including 3D mammograms and MRIs for breast health.  She is advised to follow up for cholesterol monitoring and to assess the effectiveness of the new treatment regimen. Schedule follow-up for cholesterol check. Consider e-visits for medication side effects or issues. Follow up for Comprehensive Physical Exam (CPE) preventive care annual visit.  Diagnoses and all orders for this visit: Hyperlipidemia, unspecified hyperlipidemia type -     rosuvastatin (CRESTOR) 20 MG tablet; Take 1 tablet (20 mg total) by mouth daily. -     Lipid panel -     Comprehensive metabolic panel with GFR -     CBC with Differential/Platelet -     TSH Rfx on Abnormal to Free T4 Hot flash, menopausal -     Fezolinetant (VEOZAH) 45 MG TABS; Take 1 tablet (45 mg total) by mouth daily at 6 (six) AM. Hypertriglyceridemia -     omega-3 acid ethyl esters (LOVAZA) 1 g capsule; Take 2 capsules (2 g total) by mouth 2 (two) times daily. Insulin resistance, Hx Central adiposity Vitamin D  deficiency Postmenopausal atrophic vaginitis -     estradiol (ESTRACE) 0.1 MG/GM vaginal cream; Place 1 Applicatorful vaginally at bedtime.  Future Appointments  Date Time Provider Department Center  02/28/2024  9:20 AM Lula Olszewski, MD [facility]       Additional notes: This document was synthesized by artificial intelligence ([employer]bridge) using HIPAA-compliant recording of the clinical interaction;   We discussed the use of AI scribe software for clinical note transcription with the patient, who gave verbal consent to proceed.    Additional  Info: This encounter employed state-of-the-art, real-time, collaborative documentation. The patient actively reviewed and assisted in updating their electronic medical record on a shared screen, ensuring transparency and facilitating joint problem-solving for the problem list, overview, and plan. This approach promotes accurate, informed care. The treatment plan was discussed and reviewed in detail, including medication safety, potential side effects, and all patient questions. We confirmed understanding and comfort with the plan. Follow-up instructions were established, including contacting the office for any concerns, returning if symptoms worsen, persist, or new symptoms develop, and precautions for potential emergency department visits.  Initial Appointment Goals:  This initial visit focused on establishing a foundation for the patient's care. We collaboratively reviewed her medical history and medications in detail, updating the chart as shown in the encounter. Given the extensive information, we prioritized addressing her most pressing concerns, which she reported were: new pt (Pt is present to est care with PCP pt would like to discuss Rosuvastatin she takes it three times week.)  While the complexity of the patient's medical picture may necessitate further evaluation in subsequent visits, we were able to develop a preliminary care plan together. To  expedite a comprehensive plan at the next visit, we encouraged the patient to gather relevant medical records from previous providers. This collaborative approach will ensure a more complete understanding of the patient's health and inform the development of a personalized care plan. We look forward to continuing the conversation and working together with the patient on achieving her health goals.   Collaborative Documentation:  Today's encounter utilized real-time, dynamic patient engagement.  Patients actively participate by directly reviewing and assisting in updating their medical records through a shared screen. This transparency empowers patients to visually confirm chart updates made by the healthcare provider.  This collaborative approach facilitates problem management as we jointly update the problem list, problem overview, and assessment/plan. Ultimately, this process enhances chart accuracy and completeness, fostering shared decision-making, patient education, and informed consent for tests and treatments.  Collaborative Treatment Planning:  Treatment plans were discussed and reviewed in detail.  Explained medication safety and potential side effects.  Encouraged participation and answered all patient questions, confirming understanding and comfort with the plan. Encouraged patient to contact our office if they have any questions or concerns. Agreed on patient returning to office if symptoms worsen, persist, or new symptoms develop.  ----------------------------------------------------- Lula Olszewski, MD  01/31/2024 11:45 AM  [facility] at [facility]:  [phone]

## 2024-01-31 NOTE — Telephone Encounter (Signed)
 Prescription sent not covered by insurance and requesting alternative, please advise

## 2024-01-31 NOTE — Patient Instructions (Addendum)
 Welcome aboard!   Today's visit was a valuable first step in understanding your health and starting your personalized care journey. We discussed your medical history and medications in detail. Given the extensive information, we prioritized addressing your most pressing concerns.  We understood those concerns to be:  new pt (Pt is present to est care with PCP pt would like to discuss Rosuvastatin she takes it three times week.)   Building a Complete Picture  To create the most effective care plan possible, we may need additional information from previous providers. We encouraged you to gather any relevant medical records for your next visit. This will help [place] build a more complete picture and develop a personalized plan together. In the meantime, we'll address your immediate concerns and provide resources to help you manage all of your medical issues.  We encourage you to use MyChart to review these efforts, and to help [place] find and correct any omissions or errors in your medical chart.  Managing Your Health Over Time  Managing every aspect of your health in a single visit isn't always feasible, but that's okay.  We addressed your most pressing concerns today and charted a course for future care. Acute conditions or preventive care measures may require further attention.  We encourage you to schedule a follow-up visit at your earliest convenience to discuss any unresolved issues.  We strongly encourage participation in annual preventive care visits to help [place] develop a more thorough understanding of your health and to help you maintain optimal wellness - please inquire about scheduling your next one with [place] at your earliest convenience.  Your Satisfaction Matters  It was a pleasure seeing you today!  Your health and satisfaction will always be my top priorities. If you believe your experience today was worthy of a 5-star rating, I'd be grateful for your feedback!  Tricia Olszewski, MD   Next  Steps  Schedule Follow-Up:  We recommend a follow-up appointment in No follow-ups on file. If your condition worsens before then, please call us or seek emergency care. Preventive Care:  Don't forget to schedule your annual preventive care visit!  This important checkup is typically covered by insurance and helps identify potential health issues early.  Typically its 100% insurance covered with no co-pay and helps to get surveillance labwork paid for through your insurance provider.  Sometimes it even lowers your insurance premiums to participate. Medical Information Release:  For any relevant medical information we don't have, please sign a release form so we can obtain it for your records. Lab & X-ray Appointments:  Scheduled any incomplete lab tests today or call us to schedule.  X-Rays can be done without an appointment at [facility] at [facility] ([address], [address]), M-F 8:30am-noon or 1pm-5pm.  Just tell them you're there for X-rays ordered by Dr. Jon Billings.  We'll receive the results and contact you by phone or MyChart to discuss next steps.  Bring to Your Next Appointment  Medications: Please bring all your medication bottles to your next appointment to ensure we have an accurate record of your prescriptions. Health Diaries: If you're monitoring any health conditions at home, keeping a diary of your readings can be very helpful for discussions at your next appointment.  Reviewing Your Records  Please Review this early draft of your clinical notes below and the final encounter summary tomorrow on MyChart after its been completed.   Hyperlipidemia, unspecified hyperlipidemia type Assessment & Plan: She has been on rosuvastatin  5 mg three times a week since November due to borderline cholesterol levels. Her HDL is exceptionally high, and her overall risk of heart disease is low. However, due to a family history of stroke and her use of estrogen replacement therapy, continuing  rosuvastatin is recommended to mitigate stroke risk. She is not experiencing any side effects from rosuvastatin. The decision to continue rosuvastatin is supported by the fact that her ten-year risk of stroke is only 1%, and guidelines typically recommend rosuvastatin at a 7.5% risk. However, given her estrogen use and family history, rosuvastatin is advised to prevent artery clogging. Prescribe rosuvastatin 20 mg with instructions to titrate dose based on tolerance. Encourage consumption of avocados and extra virgin olive oil for cholesterol management. Consider fish oil supplementation if covered by insurance.  Orders: -     Rosuvastatin Calcium; Take 1 tablet (20 mg total) by mouth daily.  Dispense: 90 tablet; Refill: 3 -     Lipid panel -     Comprehensive metabolic panel with GFR -     CBC with Differential/Platelet -     TSH Rfx on Abnormal to Free T4  Hot flash, menopausal -     Veozah; Take 1 tablet (45 mg total) by mouth daily at 6 (six) AM.  Dispense: 30 tablet; Refill: 11  Hypertriglyceridemia -     Omega-3-acid Ethyl Esters; Take 2 capsules (2 g total) by mouth 2 (two) times daily.  Dispense: 360 capsule; Refill: 3  Insulin resistance, Hx Assessment & Plan: She has gestational diabetes and abdominal adiposity, indicating mild insulin resistance. Weight management was discussed as a strategy to improve insulin sensitivity. The challenges of weight loss were acknowledged, and the potential use of weight loss medications was discussed, though current insurance coverage is limited. Encourage weight loss through lifestyle modifications. Discuss potential use of weight loss medications if lifestyle changes are insufficient.   Central adiposity Assessment & Plan: Encouraged weight loss  Discussed local options GLP-1 agonist not covered, she doesn't qualify for bariatrics, she    Vitamin D deficiency Assessment & Plan: She has vitamin D deficiency and is currently taking 1000 IU  daily. This is important for bone health, especially if transitioning off estrogen therapy. Continue vitamin D supplementation at 1000 IU daily.   Postmenopausal atrophic vaginitis -     Estradiol; Place 1 Applicatorful vaginally at bedtime.  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 This may be related to bone health and estrogen use. Continue vitamin D supplementation.   FH: stroke     Getting Answers and Following Up  Simple Questions & Concerns: For quick questions or basic follow-up after your visit, reach [place] at [phone] or MyChart messaging. Complex Concerns: If your concern is more complex, scheduling an appointment might be best. Discuss this with the staff to find the most suitable option. Lab & Imaging Results: We'll contact you directly if results are abnormal or you don't use MyChart. Most normal results will be on MyChart within 2-3 business days, with a review message from Dr. Jon Billings. Haven't heard back in 2 weeks? Need results sooner? Contact us at [phone]. Referrals: Our referral coordinator will manage specialist referrals. The specialist's office should contact you within 2 weeks to schedule an appointment. Call us if you haven't heard from them after 2 weeks.  Staying Connected  MyChart: Activate  your MyChart for the fastest way to access results and message [place]. See the last page of this paperwork for instructions.  Billing  X-ray & Lab Orders: These are billed by separate companies. Contact the invoicing company directly for questions or concerns. Visit Charges: Discuss any billing inquiries with our administrative services team.  Feedback & Satisfaction  Share Your Experience: We strive for your satisfaction! If you have any complaints, please let Dr.  Jon Billings know directly or contact our Practice Administrators, Edwena Felty or [employer], by asking at the front desk.  Scheduling Tips  Shorter Wait Times: 8 am and 1 pm appointments often have the quickest wait times. Longer Appointments: If you need more time during your visit, talk to the front desk. Due to insurance regulations, multiple back-to-back appointments might be necessary.      Below is an in-depth comparison of fish oil, extra virgin olive oil (EVOO), and avocados in terms of their fat composition and impacts on heart health.  Overview of the Oils and Avocado Fish Oil: Fat Composition: Fish oil is rich in omega-3 polyunsaturated fatty acids (PUFAs), primarily eicosapentaenoic acid (EPA) and docosahexaenoic acid (DHA). Heart Health Benefits: EPA and DHA have been shown to lower triglyceride levels, reduce inflammation, improve blood vessel function, and decrease the risk of arrhythmias. Extensive research indicates that omega-3s play a significant role in lowering the overall risk of cardiovascular disease. Extra Virgin Olive Oil (EVOO): Fat Composition: EVOO is high in monounsaturated fatty acids (MUFAs), particularly oleic acid. It also contains small amounts of omega-3 fatty acids and is abundant in polyphenols and antioxidants that help fight inflammation and oxidative stress. Heart Health Benefits: The antioxidants in EVOO help reduce LDL cholesterol oxidation, improve endothelial function, and contribute to reduced blood pressure. These protective factors contribute to the well?documented benefits of the Mediterranean diet in reducing heart disease risk. Avocados: Fat Composition: Avocados provide a rich source of MUFAs--mainly oleic acid--as well as dietary fiber, potassium, and various micronutrients. Heart Health Benefits: The combination of healthy fats and fiber in avocados has been associated with improved cholesterol profiles (lower LDL and higher HDL), better  blood pressure regulation, and an overall reduced risk of cardiovascular disease. Their broad nutrient profile supports metabolic health, which in turn benefits heart health.  Detailed Comparison 1. Type of Fats Fish Oil: Omega-3 Fatty Acids: The high levels of EPA and DHA make fish oil distinct from plant-based oils. These PUFAs are directly involved in anti-inflammatory processes, reducing the secretion of inflammatory cytokines that can contribute to atherosclerosis and other heart conditions. Extra Virgin IT trainer and Avocados: Monounsaturated Fats (MUFAs): Both EVOO and avocados are rich in oleic acid, a MUFA that has been found to lower LDL (bad) cholesterol while possibly increasing HDL (good) cholesterol. Additional Nutrients: EVOO brings a powerful mix of antioxidants (polyphenols) that work to mitigate oxidative stress, which is a key contributor to plaque formation in arteries. Avocados also provide fiber--a nutrient that helps control blood sugar and cholesterol levels--along with a range of vitamins and minerals. 2. Mechanisms of Cardiovascular Protection Fish Oil: The omega-3 fatty acids in fish oil are incorporated into cell membranes, influencing their fluidity and functioning as precursors to bioactive lipid mediators (resolvins and protectins). These mediators help resolve inflammation, lower triglycerides, and may reduce the risk of blood clot formation. Numerous clinical trials have linked higher intakes of fish-derived omega-3s with reduced risks of sudden cardiac death, reduced arterial plaque formation, and improved heart rhythm stability. Extra Virgin IT trainer: Beyond  its fat profile, EVOO's antioxidants reduce oxidative modification of LDL particles. This is important because oxidized LDL is more readily taken up by macrophages, contributing to the formation of atherosclerotic plaques. EVOO also plays a role in improving endothelial function and reducing inflammatory  markers, both important for maintaining arterial health. Avocados: The MUFAs in avocados mirror many of the benefits seen with EVOO. Additionally, the high fiber content improves lipid profiles by reducing the absorption of cholesterol in the gut. The abundant potassium in avocados helps regulate blood pressure--a critical factor in cardiovascular health. Furthermore, avocados contain phytochemicals that may offer added protection against oxidative stress. 3. Dietary Context and Consumption Form Fish Oil: Often consumed as supplements or through fatty fish (like salmon, mackerel, or sardines), fish oil is an efficient way to boost omega-3 intake, especially when fish consumption is low. It is especially recommended for individuals who require targeted intervention for high triglycerides or inflammation-driven heart conditions. Extra Virgin IT trainer & Avocados: Both are widely used as whole food ingredients in a balanced diet. EVOO is a key component of the Mediterranean diet and is often used in dressings, cooking, and even as a finishing oil to preserve its bioactive compounds. Avocados can be consumed as whole fruits, mashed into spreads, or added to salads, providing a package of healthy fats along with fiber, vitamins, and minerals that contribute to overall heart health.  Summary of Differences in Fat and Heart Health Effects Fat Type: Fish Oil: Omega-3 PUFAs (EPA and DHA) primarily help with anti-inflammatory action and triglyceride reduction. EVOO & Avocados: Rich in MUFAs (oleic acid) that improve cholesterol profiles and reduce oxidative stress. Avocados add dietary fiber and other micronutrients beneficial to cardiovascular health. Mechanism of Action: Fish Oil: Works largely through direct modulation of inflammation, triglyceride levels, and heart rhythm. EVOO: Combines healthy fats with antioxidant polyphenols to prevent LDL oxidation and support endothelial function. Avocados:  Provide heart benefits via healthy fats, fiber, and additional nutrients such as potassium that help manage blood pressure. Dietary Integration: Fish Oil: Usually taken as a supplement or incorporated via fatty fish in the diet. EVOO & Avocados: Consumed as whole foods that contribute not only to fat intake but also to overall nutrient diversity and dietary enjoyment, consistent with heart-healthy eating patterns.  Conclusion While all three--fish oil, extra virgin olive oil, and avocados--offer cardiovascular benefits, they do so in complementary ways: Fish oil is best known for its high omega-3 content that directly combats inflammation and lowers triglycerides. Extra virgin olive oil offers heart protection through MUFAs and a robust array of antioxidants that support arterial health. Avocados provide a combination of healthy fats, fiber, and essential nutrients that improve lipid profiles and help maintain blood pressure. Incorporating a mix of these foods into a balanced diet can help harness a broad range of heart-healthy benefits, each contributing to overall cardiovascular wellness in its own unique way. ?cite? ?cite?          Sinusitis and associated upper respiratory infections:    A sinus infection is soreness and swelling (inflammation) of your sinuses. Sinuses are hollow spaces in the bones around your face. They are located: Around your eyes. In the middle of your forehead. Behind your nose. In your cheekbones. Your sinuses and nasal passages are lined with a fluid called mucus. Mucus drains out of your sinuses. Swelling can trap mucus in your sinuses. This lets germs (bacteria, virus, or fungus) grow, which leads to infection. Most of the time, this condition is caused by  a virus.  Chronic allergies can increase swelling in the passages of the sinuses, reducing drainage of mucous and causing infections. What are the causes? Allergies. Asthma. Germs. Things that  block your nose or sinuses. Growths in the nose (nasal polyps). Chemicals or irritants in the air. A fungus. This is rare. What increases the risk? Having a weak body defense system (immune system). Doing a lot of swimming or diving. Using nasal sprays too much. Smoking. What are the signs or symptoms? The main symptoms of this condition are pain and a feeling of pressure around the sinuses. Other symptoms include: Stuffy nose (congestion). This may make it hard to breathe through your nose. Runny nose (drainage). Soreness, swelling, and warmth in the sinuses. A cough that may get worse at night. Being unable to smell and taste. Mucus that collects in the throat or the back of the nose (postnasal drip). This may cause a sore throat or bad breath. Being very tired (fatigued). A fever.   How can these infections spread into my ear?  The eustachian tube drains to the sinuses from the mental ear, and can become clogged and infected.   How is this diagnosed? Your symptoms. Your medical history. A physical exam. Tests to find out if your condition is short-term (acute) or long-term (chronic). Your doctor may: Check your nose for growths (polyps). Check your sinuses using a tool that has a light on one end (endoscope). Check for allergies or germs. Do imaging tests, such as an MRI or CT scan. How is this treated? The most important thing, regardless of the cause, is the sinus rinse & steroid nasal spray combo:  Saline misting nasal spray clears allergens, irritants, mucus from sinuses. Use it every time you feel the need to blow your nose and just before using the steroid nasal spray. This allows the nasal steroids to reach & open Eustachian tubes and reduce symptoms  Tilt head to the side over sink. Insert nozzle into one nostril, depressing on the textured area at the base of the nozzle so a gentle mist fills sinus passages and flows out nostrils. Repeat in other  nostril. Additional Treatment for this condition depends on the cause and whether it is short-term or long-term. If caused by a virus, your symptoms should go away on their own within 10 days. You may be given medicines to relieve symptoms. They include: OTC (available over the counter without a prescription) Medicines like robitussin are great to manage multiple symptom(s)  If its COVID then there is a special medicine for that, but no medications for other specific viruses yet Medicines that treat allergies (antihistamines). Benadryl is strongest antihistamine but very drowsy.  Levocetirizine (xyzal) is the strongest non-drowsy antihistamine.  There are antihistamine eye drops  Over-the-counter pain relievers Decongestants: OTC drugs like Afrin (oxymetazoline) or Sudafed (pseudoephedrine) work by reducing the swelling of blood vessels in the nasal passages and Eustachian tubes.  Never use these medications for more than a few days at a time, especially afrin is dependency-forming. If this isn't working or you need more than a few days of afrin or sudafed... you should make an appointment.   These kinds of decongestants are less effective in infections than in allergies.  If caused by bacteria, your doctor may wait to see if you will get better without treatment. Consider starting antibiotics only when: Symptoms Last Over 10 Days: If your symptoms persist beyond 10 days, it might be a sign of a bacterial infection. Severe Symptoms:  High fever, significant facial pressure, or severe pain could indicate a need for antibiotics. Ear Pain: Pain radiating to your ears can suggest sinus involvement that might benefit from antibiotics Thick Creamy Discharge: Thick, yellow, or green mucus may be a sign of a bacterial infection, whether its discharged from the nose, the eyes, or the throat. Worsening Symptoms: If your symptoms initially improve but then worsen, it's important to get re-evaluated.  Our  Approach to Your Care: Starting with Non-Antibiotics: In most cases, we can manage sinus infections with rest, over-the-counter pain relievers, and home remedies like nasal irrigation. This helps [place] avoid unnecessary antibiotics. Usually, an infection can be cleared with good sinus hygiene without the risk of antibiotics. Monitoring Your Progress: Let's keep in touch about your symptoms. If they worsen or meet the criteria listed above, please contact us right away. Completing the Course: If antibiotics become necessary, it's crucial to finish the entire prescription to ensure complete healing. Supporting Gut Health: Probiotics can be helpful alongside antibiotics to maintain healthy gut bacteria.  Yogurt is cheaper than the pills.  If caused by growths in the nose, surgery may be needed.  Follow these instructions at home: Medicines Take, use, or apply over-the-counter and prescription medicines only as told by your doctor. These may include nasal sprays. If you were prescribed an antibiotic medicine, take it as told by your doctor. Do not stop taking it even if you start to feel better. Hydrate and humidify  Drink enough water to keep your pee (urine) pale yellow. Use a cool mist humidifier to keep the humidity level in your home above 50%. Breathe in steam for 10-15 minutes, 3-4 times a day, or as told by your doctor. You can do this in the bathroom while a hot shower is running. Try not to spend time in cool or dry air. Decongest and rinse out your sinuses with daily SimplySaline gentle misting spray- apply any medicated sinus sprays after using this misting to blow your nose. Rest Rest as much as you can. Sleep with your head raised (elevated). Make sure you get enough sleep each night. General instructions  Put a warm, moist washcloth on your face 3-4 times a day, or as often as told by your doctor. Use nasal saline washes as often as told by your doctor. Wash your hands often with  soap and water. If you cannot use soap and water, use hand sanitizer. Do not smoke. Avoid being around people who are smoking (secondhand smoke). Keep all follow-up visits. Contact a doctor if: You have a fever. Your symptoms get worse. Your symptoms do not get better within 10 days. Get help right away if: You have a very bad headache. You cannot stop vomiting. You have very bad pain or swelling around your face or eyes. You have trouble seeing. You feel confused. Your neck is stiff. You have trouble breathing. These symptoms may be an emergency. Get help right away. Call 911. Do not wait to see if the symptoms will go away. Do not drive yourself to the hospital. Summary A sinus infection is swelling of your sinuses. Sinuses are hollow spaces in the bones around your face. This condition is caused by tissues in your nose that become inflamed or swollen. This traps germs. These can lead to infection. Daily sinus rinsing can reduce your risk for these. If you were prescribed an antibiotic medicine, take it as told by your doctor. Do not stop taking it even if you start to feel  better. Keep all follow-up visits.

## 2024-01-31 NOTE — Assessment & Plan Note (Signed)
 Encouraged weight loss  Discussed local options GLP-1 agonist not covered, she doesn't qualify for bariatrics, she

## 2024-02-03 ENCOUNTER — Encounter: Payer: Self-pay | Admitting: Internal Medicine

## 2024-02-11 ENCOUNTER — Encounter: Payer: Self-pay | Admitting: Internal Medicine

## 2024-02-13 ENCOUNTER — Other Ambulatory Visit: Payer: Self-pay | Admitting: Pharmacist

## 2024-02-13 NOTE — Progress Notes (Signed)
 02/13/2024 Name: Tricia Berger MRN: [id number] DOB: 05/21/1966  Chief Complaint  Patient presents with   Medication Management    Veozah     Tricia Berger is a 58 y.o. year old female. They were referred to the pharmacist by their PCP for assistance in managing medication access.    Subjective:    Medication Access/Adherence  Current Pharmacy:  [id number] - [place], [place] - [address] [place] [address] Phone: [phone] Fax: [phone]   Patient was prescribed Veozah  for menopausal symptoms / hot flashes but it is not covered by her insurance. PCP asked if I could check on getting prior authorization. Patient is currently using esttogen patches but  Dr Boston Byers would prefer to not use estrogen replacement if possible due to family history of stroke.   She has also been prescribe vaginal estrace  for atrophic vaginitis.      Objective:  Lab Results  Component Value Date   HGBA1C 5.1 09/05/2023    Lab Results  Component Value Date   CREATININE 0.72 09/05/2023   BUN 17 09/05/2023   NA 140 09/05/2023   K 4.5 09/05/2023   CL 102 09/05/2023   CO2 27 09/05/2023    Lab Results  Component Value Date   CHOL 207 (H) 09/05/2023   HDL 78 09/05/2023   LDLCALC 101 (H) 09/05/2023   TRIG 182 (H) 09/05/2023   CHOLHDL 2.7 09/05/2023    Medications Reviewed Today     Reviewed by Cecilie Coffee, RPH-CPP (Pharmacist) on 02/13/24 at 1355  Med List Status: <None>   Medication Order Taking? Sig Documenting Provider Last Dose Status Informant  cetirizine (ZYRTEC) 10 MG tablet [id number] No Take 10 mg by mouth daily. OTC [provider] Taking Active   Cholecalciferol (VITAMIN D  PO) [id number] No Take 2,000 Int'l Units by mouth daily.  [provider] Taking Active   clobetasol (TEMOVATE) 0.05 % external solution [id number] No Apply topically 2 (two) times daily as needed.  Patient  not taking: Reported on 01/31/2024   [provider] Not Taking Active   estradiol  (CLIMARA  - DOSED IN MG/24 HR) 0.05 mg/24hr patch [id number] No Place 0.05 mg onto the skin once a week. [provider] Taking Active   estradiol  (ESTRACE ) 0.1 MG/GM vaginal cream [id number]  Place 1 Applicatorful vaginally at bedtime. Anthon Kins, MD  Active   Fezolinetant  (VEOZAH ) 45 MG TABS [id number]  Take 1 tablet (45 mg total) by mouth daily at 6 (six) AM. Anthon Kins, MD  Active   Multiple Vitamin (MULTIVITAMIN) tablet [id number] No Take 1 tablet by mouth daily. [provider] Taking Active   omega-3 acid ethyl esters (LOVAZA ) 1 g capsule [id number]  Take 2 capsules (2 g total) by mouth 2 (two) times daily. Anthon Kins, MD  Active   progesterone (PROMETRIUM) 100 MG capsule [id number] No Take 100 mg by mouth at bedtime. [provider] Taking Active   rosuvastatin  (CRESTOR ) 20 MG tablet [id number]  Take 1 tablet (20 mg total) by mouth daily. Anthon Kins, MD  Active   TURMERIC PO [id number] No Take 1 tablet by mouth daily. [provider] Taking Active               Assessment/Plan:   Medication Management / Access:  - Submitted prior authorization for Veozah  - pending Tricia Berger (Key: [id number]) - [id number] - will check back in 48 to  72 hours.    Cecilie Coffee, PharmD Clinical Pharmacist [facility]  Population Health [phone]

## 2024-02-14 ENCOUNTER — Telehealth: Payer: Self-pay | Admitting: Pharmacist

## 2024-02-14 NOTE — Telephone Encounter (Signed)
 Submitted prior authorization for Veozah . Initial prior authorization was denied. See information below.  PCP and patient have been notified. She is already taking alternative therapy - estradiol  patches.

## 2024-02-28 ENCOUNTER — Encounter: Payer: Self-pay | Admitting: Internal Medicine

## 2024-02-28 ENCOUNTER — Ambulatory Visit (INDEPENDENT_AMBULATORY_CARE_PROVIDER_SITE_OTHER): Admitting: Internal Medicine

## 2024-02-28 VITALS — BP 110/72 | HR 85 | Temp 97.7°F | Ht 59.5 in | Wt 134.8 lb

## 2024-02-28 DIAGNOSIS — Z87828 Personal history of other (healed) physical injury and trauma: Secondary | ICD-10-CM

## 2024-02-28 DIAGNOSIS — E663 Overweight: Secondary | ICD-10-CM

## 2024-02-28 DIAGNOSIS — E782 Mixed hyperlipidemia: Secondary | ICD-10-CM

## 2024-02-28 DIAGNOSIS — Z0001 Encounter for general adult medical examination with abnormal findings: Secondary | ICD-10-CM | POA: Diagnosis not present

## 2024-02-28 DIAGNOSIS — E88819 Insulin resistance, unspecified: Secondary | ICD-10-CM

## 2024-02-28 DIAGNOSIS — E559 Vitamin D deficiency, unspecified: Secondary | ICD-10-CM

## 2024-02-28 DIAGNOSIS — E8881 Metabolic syndrome: Secondary | ICD-10-CM

## 2024-02-28 DIAGNOSIS — E65 Localized adiposity: Secondary | ICD-10-CM

## 2024-02-28 DIAGNOSIS — Z780 Asymptomatic menopausal state: Secondary | ICD-10-CM

## 2024-02-28 DIAGNOSIS — R7989 Other specified abnormal findings of blood chemistry: Secondary | ICD-10-CM

## 2024-02-28 DIAGNOSIS — Z23 Encounter for immunization: Secondary | ICD-10-CM

## 2024-02-28 LAB — VITAMIN D 25 HYDROXY (VIT D DEFICIENCY, FRACTURES): VITD: 83.28 ng/mL (ref 30.00–100.00)

## 2024-02-28 LAB — LIPID PANEL W/REFLEX DIRECT LDL
Cholesterol: 130 mg/dL (ref <200)
HDL: 68 mg/dL (ref >=50)
LDL Cholesterol (Calc): 43 mg/dL
Non-HDL Cholesterol (Calc): 62 mg/dL (ref <130)
Total CHOL/HDL Ratio: 1.9 (calc) (ref <5.0)
Triglycerides: 109 mg/dL (ref <150)

## 2024-02-28 LAB — COMPREHENSIVE METABOLIC PANEL WITH GFR
ALT: 43 U/L — ABNORMAL HIGH (ref 0–35)
AST: 31 U/L (ref 0–37)
Albumin: 4.5 g/dL (ref 3.5–5.2)
Alkaline Phosphatase: 59 U/L (ref 39–117)
BUN: 13 mg/dL (ref 6–23)
CO2: 27 meq/L (ref 19–32)
Calcium: 9.5 mg/dL (ref 8.4–10.5)
Chloride: 102 meq/L (ref 96–112)
Creatinine, Ser: 0.58 mg/dL (ref 0.40–1.20)
GFR: 101.24 mL/min (ref >60.00)
Glucose, Bld: 97 mg/dL (ref 70–99)
Potassium: 4.2 meq/L (ref 3.5–5.1)
Sodium: 138 meq/L (ref 135–145)
Total Bilirubin: 0.6 mg/dL (ref 0.2–1.2)
Total Protein: 7.6 g/dL (ref 6.0–8.3)

## 2024-02-28 LAB — CBC WITH DIFFERENTIAL/PLATELET
Basophils Absolute: 0.1 10*3/uL (ref 0.0–0.1)
Basophils Relative: 1 % (ref 0.0–3.0)
Eosinophils Absolute: 0.2 10*3/uL (ref 0.0–0.7)
Eosinophils Relative: 2.1 % (ref 0.0–5.0)
HCT: 42.1 % (ref 36.0–46.0)
Hemoglobin: 14.2 g/dL (ref 12.0–15.0)
Lymphocytes Relative: 34.3 % (ref 12.0–46.0)
Lymphs Abs: 2.7 10*3/uL (ref 0.7–4.0)
MCHC: 33.7 g/dL (ref 30.0–36.0)
MCV: 90.2 fl (ref 78.0–100.0)
Monocytes Absolute: 0.5 10*3/uL (ref 0.1–1.0)
Monocytes Relative: 6.7 % (ref 3.0–12.0)
Neutro Abs: 4.4 10*3/uL (ref 1.4–7.7)
Neutrophils Relative %: 55.9 % (ref 43.0–77.0)
Platelets: 299 10*3/uL (ref 150.0–400.0)
RBC: 4.67 Mil/uL (ref 3.87–5.11)
RDW: 12.7 % (ref 11.5–15.5)
WBC: 7.8 10*3/uL (ref 4.0–10.5)

## 2024-02-28 LAB — HEMOGLOBIN A1C: Hgb A1c MFr Bld: 5.3 % (ref 4.6–6.5)

## 2024-02-28 NOTE — Progress Notes (Unsigned)
 Phone [phone]  -- Comprehensive Physical Exam (CPE) Annual Office Visit  --  Patient:  Tricia Berger      Age: 58 y.o.       Sex:  female  Date:   02/28/2024 Patient Care Team: Anthon Kins, MD as PCP - General (Internal Medicine) Flavia Hughs, PA-C as Physician Assistant (Internal Medicine) Christina Coyer, MD as Consulting Physician (Urology) Today's Healthcare Provider: Anthon Kins, MD  ------------------------------------------------------------------------------------------------------------------------------------- Chief Complaint  Patient presents with   Annual Exam    Pt is present for CPE is not fasting today for labs.    Purpose of Visit: Comprehensive preventive health assessment and personalized health maintenance planning.  This encounter was conducted as a Comprehensive Physical Exam (CPE) preventive care annual visit. The patient's medical history and problem list were reviewed to inform individualized preventive care recommendations. No problem-specific medical treatment was provided during this visit.   Assessment & Plan Encounter for annual general medical examination with abnormal findings in adult  Postmenopausal estrogen deficiency  Overweight  Assessment and Plan Assessment & Plan     There are no diagnoses linked to this encounter.   Encounter orders: ED Discharge Orders     None        Today's preventive care visit included comprehensive health maintenance evaluation and gap closure alongside extensive anticipatory guidance, as follows: #  Eye exams: recommended every 1-2 years.   She voiced understanding and intent to adhere to that plan. #  Dental health: recommended regular tooth brushing, flossing, and dental visits every 6 months.  She voiced understanding and intent to adhere to that plan. #  Sinus health: nightly SimplySaline or similar recommended now.  She voiced understanding and intent to adhere to that plan. #   Diet/Exercise:   recommended regular exercise (150 min) and diet rich and fruits and vegetables and fiber and healthy fats to reduce risk of heart attack and stroke. She voiced understanding and intent to adhere to this plan. #  Sexuality: recommended STD prevention via partner selection & condoms.  Offered contraception / STD checks / genital wart treatment if appropriate. #  Cervical/Breast/Uterine/Ovarian cancer screening: strongly recommended follow up gynecology for comprehensive screenings. #  Thyroid  cancer screening:  discussed need to palpate thyroid  about every 6 months for nodules #  Colon cancer screening:  denies family history of colon cancer or any GI bleeding,  advised 14-80 year old we should discuss and plan for  *** #  Skin cancer screening: recommended regular sunscreen use. she denies worrisome, changing, or new skin lesions. *** #  Osteoporosis prevention:  recommended to maintain a good source of calcium  and vitamin D  in diet.  She denies any personal history of early osteoporosis or fragility fractures #  Substance use: recommended absolute abstinence from all vaped or smoked recreational or illicit substances of abuse such as tobacco, nicotine, alcohol, illicit drugs, even sugar.  #  Safety:  recommended avoiding high risk activities, wear seat belts and not texting & driving #  Health maintenance and immunizations reviewed and she was encouraged to complete any incomplete and release any missing immunization records for us  Immunization History  Administered Date(s) Administered   Influenza Inj Mdck Quad With Preservative 06/23/2019, 09/01/2020   Influenza,inj,Quad PF,6+ Mos 09/10/2022   Influenza-Unspecified 08/01/2016, 08/07/2017, 08/06/2018, 07/09/2021   PFIZER(Purple Top)SARS-COV-2 Vaccination 01/05/2020, 01/25/2020, 09/26/2020, 07/09/2021   PPD Test 07/05/2014, 07/07/2015, 08/06/2016   Pfizer(Comirnaty)Fall Seasonal Vaccine 12 years and older 09/10/2022   Pneumococcal  Polysaccharide-23  06/01/2010   Td 10/22/2004   Tdap 07/07/2015   #  We attempted to update vaccination records and provide any missing vaccines that are recommended. Health Maintenance Due  Topic Date Due   Zoster Vaccines- Shingrix (1 of 2) Never done   She made a personal decision to not get the vaccine for shingles. She had shingles.  [facility]  # Incomplete health maintenance issues listed above were brought up for discussion and she was encouraged to complete with our assistance. # Recommended follow up:  continued annual preventive health maintenance exams  Subjective   She has Insulin  resistance, Hx; Vitamin D  deficiency; Environmental allergies; BMI 25.0-25.9,adult; Hyperlipidemia; Polyarthralgia; and Central adiposity on their problem list. HPI Discussed the use of AI scribe software for clinical note transcription with the patient, who gave verbal consent to proceed.  History of Present Illness     ROS  A comprehensive ROS was negative for any concerning symptoms.  Disclaimer about ROS at Annual Preventive Visits Patients are informed before the Review of Systems (ROS) that identifying significant medical issues during the wellness visit may require immediate attention, potentially resulting in a separate billable encounter beyond the scope of the preventive exam. This disclosure is mandated by professional ethics and legal obligations, as healthcare providers must address any substantial health concerns raised during any patient interaction. A comprehensive ROS is required by insurance companies for billing the visit. However, this structure may inadvertently discourage patients from fully disclosing health concerns due to potential financial implications. Consequently, patients often emphasize that any positive ROS findings are related to stable chronic conditions, requesting that these not be discussed during the preventive visit to avoid additional  charges. Patients may also ask that reported complaints not be listed in the ROS to prevent affecting billing.Patient's Request Regarding Billing and Review of Systems   PROBLEMS,PMH, PSH, FH, prior meds, allergies, and SH were each reviewed and updated:    01/31/2024   10:33 AM  Depression screen PHQ 2/9  Decreased Interest 0  Down, Depressed, Hopeless 0  PHQ - 2 Score 0  Altered sleeping 0  Tired, decreased energy 0  Change in appetite 0  Feeling bad or failure about yourself  0  Trouble concentrating 0  Moving slowly or fidgety/restless 0  Suicidal thoughts 0  PHQ-9 Score 0  Difficult doing work/chores Not difficult at all   Patient Active Problem List   Diagnosis Date Noted   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   Polyarthralgia 02/18/2020   Environmental allergies 08/27/2018   BMI 25.0-25.9,adult 08/27/2018   Hyperlipidemia 08/27/2018   Insulin  resistance, Hx 07/05/2014   Vitamin D  deficiency 07/05/2014   Past Medical History:  Diagnosis Date   Allergy    Gestational diabetes mellitus, Hx 07/05/2014   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   Reaction, adjustment, with anxious, depressed mood 09/02/2019   Seasonal allergies     Past Surgical History:  Procedure Laterality Date   ANTERIOR CRUCIATE LIGAMENT REPAIR Left 02/2017   With meniscectomy   ARTHROSCOPIC REPAIR ACL Left 02/2017   COLONOSCOPY  04/28/2021   Dr.Armbruster   POLYPECTOMY     WISDOM TOOTH EXTRACTION     Family History  Problem Relation Age of Onset   Hypertension Mother    Breast cancer Maternal Aunt 60   Heart disease Maternal Grandmother    Breast cancer Maternal Grandmother 80   Heart disease Maternal Grandfather    Diabetes type II Maternal Grandfather    Colon polyps Neg Hx  Colon cancer Neg Hx    Esophageal cancer Neg Hx    Stomach cancer Neg Hx    Rectal cancer Neg Hx    No Known Allergies  Social History   Tobacco Use   Smoking status: Never   Smokeless tobacco: Never  Vaping Use   Vaping  status: Never Used  Substance Use Topics   Alcohol use: Yes    Comment: Social, very rarely 1-2 drinks annually   Drug use: No     I attest that I have reviewed and confirmed the patients current medications to meet the medication reconciliation requirement *** Current Outpatient Medications on File Prior to Visit  Medication Sig   cetirizine (ZYRTEC) 10 MG tablet Take 10 mg by mouth daily. OTC   Cholecalciferol (VITAMIN D  PO) Take 2,000 Int'l Units by mouth daily.    estradiol  (CLIMARA  - DOSED IN MG/24 HR) 0.05 mg/24hr patch Place 0.05 mg onto the skin once a week.   Multiple Vitamin (MULTIVITAMIN) tablet Take 1 tablet by mouth daily.   omega-3 acid ethyl esters (LOVAZA ) 1 g capsule Take 2 capsules (2 g total) by mouth 2 (two) times daily.   progesterone (PROMETRIUM) 100 MG capsule Take 100 mg by mouth at bedtime.   rosuvastatin  (CRESTOR ) 20 MG tablet Take 1 tablet (20 mg total) by mouth daily.   TURMERIC PO Take 1 tablet by mouth daily.   clobetasol (TEMOVATE) 0.05 % external solution Apply topically 2 (two) times daily as needed. (Patient not taking: Reported on 02/28/2024)   estradiol  (ESTRACE ) 0.1 MG/GM vaginal cream Place 1 Applicatorful vaginally at bedtime.   Fezolinetant  (VEOZAH ) 45 MG TABS Take 1 tablet (45 mg total) by mouth daily at 6 (six) AM.   No current facility-administered medications on file prior to visit.  There are no discontinued medications.  Objective  BP 110/72   Pulse 85   Temp 97.7 F (36.5 C) (Temporal)   Ht 4' 11.5" (1.511 m)   Wt 134 lb 12.8 oz (61.1 kg)   LMP 08/20/2017   SpO2 98%   BMI 26.77 kg/m  Body mass index is 26.77 kg/m.  Wt Readings from Last 3 Encounters:  02/28/24 134 lb 12.8 oz (61.1 kg)  01/31/24 134 lb 9.6 oz (61.1 kg)  09/05/23 138 lb 9.6 oz (62.9 kg)    This is a polite, friendly, and genuine person  Physical Exam    GENERAL:  NAD, AAO, not ill-appearing  HENT:  NCAT, normal nose, mucous membranes moist.  Tympanic membrane  evaluated and normal appearing bilaterally, oropharynx evaluated and normal appearing EYES:  sclera nonicteric, no injection CV:  RRR, no murmurs/rubs/gallops LUNG: CTAB, normal WOB, no audible wheezing or stridor ABD: soft, nondistended, no guarding, no palpable tumor GYN:  expressed a preference to do breast and gynecological exam at another office which  I supported and encouraged explaining the importance of routine gynecological screenings. SKIN: warm, dry, no lesions of concern NEURO: alert, no focal deficit obvious, articulate speech PSYCH: normal mood, behavior, thought content    Notes:  This document was synthesized by artificial intelligence ([employer]bridge) using HIPAA-compliant recording of the clinical interaction;   We discussed the use of AI scribe software for clinical note transcription with the patient, who gave verbal consent to proceed.    This encounter employed state-of-the-art, real-time, collaborative documentation. The patient was empowered to actively review and assist in updating their electronic medical record on a shared monitor, ensuring transparency and improving accuracy.    Prior to  and at the beginning of Comprehensive Physical Exam (CPE) preventive care annual visit appointment types  we clarify to patients "Our goal today is to focus on your preventive or annual Comprehensive Physical Exam (CPE) preventive care annual visit, which typically covers routine screenings and overall health maintenance. However, if you share any new or concerning symptoms--such as dizziness, passing out, severe pain, or anything else that may point to a more serious issue--we are both legally and ethically required to evaluate it. We cannot simply overlook or ignore such concerns, even if you later decide you don't want to discuss them, because it could jeopardize your health.  If addressing a new concern takes us  beyond the scope of the preventive visit, we may need to bill separately for  that portion of care. We understand financial considerations are important, and we're happy to discuss your options if something new comes up. However, we want to be clear that once you mention a potentially serious issue, we must investigate it; we can't ethically or legally exclude that from our records or our evaluation. Please let us  know all of your questions or worries. Together, we can decide how best to manage them and how to minimize any unexpected costs, but we want to keep you safe above all else."   This disclosure is mandated by professional ethics and legal obligations, as healthcare providers must address any substantial health concerns raised during any patient interaction and a comprehensive ROS is required by insurance companies for billing preventive-care visit type.    Signed: Anthon Kins, MD  [facility] [address] [place], [place] [address] Office:  [phone]    Health Maintenance, Female Adopting a healthy lifestyle and getting preventive care are important in promoting health and wellness. Ask your health care provider about: The right schedule for you to have regular tests and exams. Things you can do on your own to prevent diseases and keep yourself healthy. What should I know about diet, weight, and exercise? Eat a healthy diet  Eat a diet that includes plenty of vegetables, fruits, low-fat dairy products, and lean protein. Do not eat a lot of foods that are high in solid fats, added sugars, or sodium. Maintain a healthy weight Body mass index (BMI) is used to identify weight problems. It estimates body fat based on height and weight. Your health care provider can help determine your BMI and help you achieve or maintain a healthy weight. Get regular exercise Get regular exercise. This is one of the most important things you can do for your health. Most adults should: Exercise for at least 150 minutes each week. The exercise  should increase your heart rate and make you sweat (moderate-intensity exercise). Do strengthening exercises at least twice a week. This is in addition to the moderate-intensity exercise. Spend less time sitting. Even light physical activity can be beneficial. Watch cholesterol and blood lipids Have your blood tested for lipids and cholesterol at 56 years of age, then have this test every 5 years. Have your cholesterol levels checked more often if: Your lipid or cholesterol levels are high. You are older than 56 years of age. You are at high risk for heart disease. What should I know about cancer screening? Depending on your health history and family history, you may need to have cancer screening at various ages. This may include screening for: Breast cancer. Cervical cancer. Colorectal cancer. Skin cancer. Lung cancer. What should I know about heart disease,  diabetes, and high blood pressure? Blood pressure and heart disease High blood pressure causes heart disease and increases the risk of stroke. This is more likely to develop in people who have high blood pressure readings or are overweight. Have your blood pressure checked: Every 3-5 years if you are 26-70 years of age. Every year if you are 74 years old or older. Diabetes Have regular diabetes screenings. This checks your fasting blood sugar level. Have the screening done: Once every three years after age 32 if you are at a normal weight and have a low risk for diabetes. More often and at a younger age if you are overweight or have a high risk for diabetes. What should I know about preventing infection? Hepatitis B If you have a higher risk for hepatitis B, you should be screened for this virus. Talk with your health care provider to find out if you are at risk for hepatitis B infection. Hepatitis C Testing is recommended for: Everyone born from [birth year] through 1963. Anyone with known risk factors for hepatitis C. Sexually  transmitted infections (STIs) Get screened for STIs, including gonorrhea and chlamydia, if: You are sexually active and are younger than 56 years of age. You are older than 56 years of age and your health care provider tells you that you are at risk for this type of infection. Your sexual activity has changed since you were last screened, and you are at increased risk for chlamydia or gonorrhea. Ask your health care provider if you are at risk. Ask your health care provider about whether you are at high risk for HIV. Your health care provider may recommend a prescription medicine to help prevent HIV infection. If you choose to take medicine to prevent HIV, you should first get tested for HIV. You should then be tested every 3 months for as long as you are taking the medicine. Pregnancy If you are about to stop having your period (premenopausal) and you may become pregnant, seek counseling before you get pregnant. Take 400 to 800 micrograms (mcg) of folic acid every day if you become pregnant. Ask for birth control (contraception) if you want to prevent pregnancy. Osteoporosis and menopause Osteoporosis is a disease in which the bones lose minerals and strength with aging. This can result in bone fractures. If you are 45 years old or older, or if you are at risk for osteoporosis and fractures, ask your health care provider if you should: Be screened for bone loss. Take a calcium  or vitamin D  supplement to lower your risk of fractures. Be given hormone replacement therapy (HRT) to treat symptoms of menopause. Follow these instructions at home: Alcohol use Do not drink alcohol if: Your health care provider tells you not to drink. You are pregnant, may be pregnant, or are planning to become pregnant. If you drink alcohol: Limit how much you have to: 0-1 drink a day. Know how much alcohol is in your drink. In the [place] one drink equals one 12 oz bottle of beer (355 mL), one 5 oz glass of wine (148  mL), or one 1 oz glass of hard liquor (44 mL). Lifestyle Do not use any products that contain nicotine or tobacco. These products include cigarettes, chewing tobacco, and vaping devices, such as e-cigarettes. If you need help quitting, ask your health care provider. Do not use street drugs. Do not share needles. Ask your health care provider for help if you need support or information about quitting drugs. General instructions Schedule regular  health, dental, and eye exams. Stay current with your vaccines. Tell your health care provider if: You often feel depressed. You have ever been abused or do not feel safe at home. Summary Adopting a healthy lifestyle and getting preventive care are important in promoting health and wellness. Follow your health care provider's instructions about healthy diet, exercising, and getting tested or screened for diseases. Follow your health care provider's instructions on monitoring your cholesterol and blood pressure. This information is not intended to replace advice given to you by your health care provider. Make sure you discuss any questions you have with your health care provider. Document Revised: 02/27/2021 Document Reviewed: 02/27/2021 Elsevier Patient Education  2024 [employer].

## 2024-02-28 NOTE — Patient Instructions (Signed)
??   Trans Fats: What You Need to Know (and How to Avoid Them) Protect Your Heart, Brain, and Overall Health  ? What Are Trans Fats? Trans fats are a type of unhealthy fat that can increase your risk of: Heart disease Stroke Type 2 diabetes Inflammation Memory problems They are artificially made through a process called hydrogenation and were once common in processed foods for better shelf life and texture.  ?? Why Should I Avoid Trans Fats? Even small amounts of trans fats can: Raise "bad" LDL cholesterol Lower "good" HDL cholesterol Cause inflammation in your blood vessels Increase your risk of heart attack or stroke There is no safe level of artificial trans fat.  ?? How to Spot Trans Fats (Even When the Label Says "0g") Food companies can legally say "0 grams trans fat" if the product contains less than 0.5 grams per serving -- but that can add up fast! Look at the ingredients list for these clues: ?? Partially hydrogenated oil ? this means trans fat is present. ? Avoid foods with "shortening" or "hydrogenated" oils.  ?? Common Foods That May Contain Trans Fats Even today, you may find trans fats in: Baked goods (cookies, cakes, pies) Microwave popcorn Crackers Margarine and shortening Fried fast foods Frozen pizza  ? Healthier Choices Choose products with 0g trans fat and no "partially hydrogenated oil" in the ingredients. Use olive oil, avocado oil, or canola oil for cooking. Eat more whole, unprocessed foods: fruits, vegetables, whole grains, and lean proteins. Choose baked over fried, and fresh over packaged.  ?? Takeaway Message Trans fats are harmful, even in small amounts. To protect your health: Read labels carefully. Look beyond "0g trans fat" and scan for "partially hydrogenated oils." Choose whole foods and heart-healthy fats.

## 2024-02-29 DIAGNOSIS — Z780 Asymptomatic menopausal state: Secondary | ICD-10-CM | POA: Insufficient documentation

## 2024-02-29 DIAGNOSIS — Z23 Encounter for immunization: Secondary | ICD-10-CM | POA: Insufficient documentation

## 2024-02-29 DIAGNOSIS — E663 Overweight: Secondary | ICD-10-CM | POA: Insufficient documentation

## 2024-02-29 DIAGNOSIS — Z87828 Personal history of other (healed) physical injury and trauma: Secondary | ICD-10-CM | POA: Insufficient documentation

## 2024-02-29 DIAGNOSIS — E8881 Metabolic syndrome: Secondary | ICD-10-CM | POA: Insufficient documentation

## 2024-02-29 LAB — TSH RFX ON ABNORMAL TO FREE T4: TSH: 0.008 u[IU]/mL — ABNORMAL LOW (ref 0.450–4.500)

## 2024-02-29 LAB — T4F: T4,Free (Direct): 1.57 ng/dL (ref 0.82–1.77)

## 2024-02-29 NOTE — Assessment & Plan Note (Signed)
 Vitamin D  deficiency is being addressed with ongoing supplementation. The importance of supplementation and potential insurance coverage issues for vitamin D  lab testing were discussed, which may result in a $100 charge if denied. Continue vitamin D  supplementation and order a vitamin D  lab test, acknowledging potential insurance coverage issues.

## 2024-02-29 NOTE — Assessment & Plan Note (Signed)
Updated problem overview for this problem to improve longitudinal management  

## 2024-02-29 NOTE — Assessment & Plan Note (Signed)
 Updated problem overview for this problem to improve longitudinal management, patient expressed a preference to not move forward with

## 2024-02-29 NOTE — Assessment & Plan Note (Signed)
 Post-surgical repair of the ACL tear shows no current issues. She avoids tennis to prevent further injury and is encouraged to maintain an active lifestyle with caution. Encourage continued exercise with caution to prevent further injury.

## 2024-02-29 NOTE — Assessment & Plan Note (Signed)
 Insulin  resistance is contributing to metabolic syndrome. Dietary modifications were discussed, including avoiding trans fats. Continue the current management plan for insulin  resistance and educate on dietary changes, focusing on reducing trans fat intake.

## 2024-02-29 NOTE — Assessment & Plan Note (Signed)
 Hyperlipidemia is being managed. The impact of trans fats on cholesterol levels and cardiovascular risk was discussed, emphasizing the importance of avoiding trans fats, which are often hidden in foods due to labeling loopholes. Continue the current medication regimen for hyperlipidemia and educate on avoiding trans fats in the diet.

## 2024-03-01 ENCOUNTER — Encounter: Payer: Self-pay | Admitting: Internal Medicine

## 2024-03-01 NOTE — Progress Notes (Signed)
  Reviewed labs from 02/28/2024. Key findings: TSH very low at 0.008 uIU/mL with normal Free T4; ALT mildly elevated at 43 U/L; excellent improvement in lipid panel with all values now at goal; other results normal. Clinical interpretation: Finding consistent with subclinical hyperthyroidism, requiring follow-up testing in 4-6 weeks. Mild liver enzyme elevation needs monitoring. Next steps: Please respond to our message about your preference for thyroid  follow-up options. No immediate action needed if you're feeling well. See Patient Message for details.

## 2024-03-03 NOTE — Telephone Encounter (Signed)
 read by Anson Kinnier at 6:24AM on 03/03/2024.

## 2024-03-04 ENCOUNTER — Other Ambulatory Visit: Payer: Self-pay

## 2024-03-04 DIAGNOSIS — E038 Other specified hypothyroidism: Secondary | ICD-10-CM

## 2024-03-04 DIAGNOSIS — R7989 Other specified abnormal findings of blood chemistry: Secondary | ICD-10-CM

## 2024-03-17 ENCOUNTER — Other Ambulatory Visit

## 2024-03-18 ENCOUNTER — Other Ambulatory Visit (INDEPENDENT_AMBULATORY_CARE_PROVIDER_SITE_OTHER)

## 2024-03-18 DIAGNOSIS — E038 Other specified hypothyroidism: Secondary | ICD-10-CM | POA: Diagnosis not present

## 2024-03-18 DIAGNOSIS — R7989 Other specified abnormal findings of blood chemistry: Secondary | ICD-10-CM | POA: Diagnosis not present

## 2024-03-18 LAB — T4, FREE: Free T4: 0.61 ng/dL (ref 0.60–1.60)

## 2024-03-18 LAB — TSH: TSH: 2.94 u[IU]/mL (ref 0.35–5.50)

## 2024-03-19 ENCOUNTER — Ambulatory Visit: Payer: Self-pay | Admitting: Internal Medicine

## 2024-07-06 ENCOUNTER — Telehealth: Admitting: Family Medicine

## 2024-07-06 DIAGNOSIS — B9689 Other specified bacterial agents as the cause of diseases classified elsewhere: Secondary | ICD-10-CM

## 2024-07-06 DIAGNOSIS — J019 Acute sinusitis, unspecified: Secondary | ICD-10-CM

## 2024-07-06 MED ORDER — AMOXICILLIN-POT CLAVULANATE 875-125 MG PO TABS: 1.0000 | ORAL_TABLET | Freq: Two times a day (BID) | ORAL | 0 refills | Status: AC

## 2024-07-06 NOTE — Progress Notes (Signed)

## 2024-09-07 ENCOUNTER — Encounter: Payer: 59 | Admitting: Nurse Practitioner

## 2024-09-18 ENCOUNTER — Telehealth: Admitting: Family Medicine

## 2024-09-18 DIAGNOSIS — B9689 Other specified bacterial agents as the cause of diseases classified elsewhere: Secondary | ICD-10-CM

## 2024-09-18 DIAGNOSIS — J019 Acute sinusitis, unspecified: Secondary | ICD-10-CM

## 2024-09-18 MED ORDER — AMOXICILLIN-POT CLAVULANATE 875-125 MG PO TABS: 1.0000 | ORAL_TABLET | Freq: Two times a day (BID) | ORAL | 0 refills | Status: AC

## 2024-09-18 NOTE — Progress Notes (Signed)
 E-Visit for Sinus Problems  We are sorry that you are not feeling well.  Here is how we plan to help!  Based on what you have shared with me it looks like you have sinusitis.  Sinusitis is inflammation and infection in the sinus cavities of the head.  Based on your presentation I believe you most likely have Acute Bacterial Sinusitis.  This is an infection caused by bacteria and is treated with antibiotics. I have prescribed Augmentin  875mg /125mg  one tablet twice daily with food, for 7 days. You may use an oral decongestant such as Mucinex D or if you have glaucoma or high blood pressure use plain Mucinex. Saline nasal spray help and can safely be used as often as needed for congestion.  If you develop worsening sinus pain, fever or notice severe headache and vision changes, or if symptoms are not better after completion of antibiotic, please schedule an appointment with a health care provider.    Sinus infections are not as easily transmitted as other respiratory infection, however we still recommend that you avoid close contact with loved ones, especially the very young and elderly.  Remember to wash your hands thoroughly throughout the day as this is the number one way to prevent the spread of infection!  Home Care: Only take medications as instructed by your medical team. Complete the entire course of an antibiotic. Do not take these medications with alcohol. A steam or ultrasonic humidifier can help congestion.  You can place a towel over your head and breathe in the steam from hot water coming from a faucet. Avoid close contacts especially the very young and the elderly. Cover your mouth when you cough or sneeze. Always remember to wash your hands.  Get Help Right Away If: You develop worsening fever or sinus pain. You develop a severe head ache or visual changes. Your symptoms persist after you have completed your treatment plan.  Make sure you Understand these instructions. Will  watch your condition. Will get help right away if you are not doing well or get worse.  Your e-visit answers were reviewed by a board certified advanced clinical practitioner to complete your personal care plan.  Depending on the condition, your plan could have included both over the counter or prescription medications.  If there is a problem please reply  once you have received a response from your provider.  Your safety is important to us .  If you have drug allergies check your prescription carefully.    You can use MyChart to ask questions about today's visit, request a non-urgent call back, or ask for a work or school excuse for 24 hours related to this e-Visit. If it has been greater than 24 hours you will need to follow up with your provider, or enter a new e-Visit to address those concerns.  You will get an e-mail in the next two days asking about your experience.  I hope that your e-visit has been valuable and will speed your recovery. Thank you for using e-visits.  I have spent 5 minutes in review of e-visit questionnaire, review and updating patient chart, medical decision making and response to patient.   Roosvelt Mater, PA-C

## 2025-03-01 ENCOUNTER — Encounter: Admitting: Internal Medicine
